# Patient Record
Sex: Female | Born: 1950 | Race: White | Hispanic: No
Health system: Southern US, Community
[De-identification: ages and names within clinical notes are randomized; demographics above are authoritative.]

---

## 1998-02-17 ENCOUNTER — Other Ambulatory Visit: Admission: RE | Admit: 1998-02-17 | Discharge: 1998-02-17 | Payer: Self-pay | Admitting: Family Medicine

## 1998-09-13 ENCOUNTER — Emergency Department (HOSPITAL_COMMUNITY): Admission: EM | Admit: 1998-09-13 | Discharge: 1998-09-13 | Payer: Self-pay | Admitting: Emergency Medicine

## 1998-10-02 ENCOUNTER — Other Ambulatory Visit: Admission: RE | Admit: 1998-10-02 | Discharge: 1998-10-02 | Payer: Self-pay | Admitting: Family Medicine

## 1999-04-07 ENCOUNTER — Other Ambulatory Visit: Admission: RE | Admit: 1999-04-07 | Discharge: 1999-04-07 | Payer: Self-pay | Admitting: Family Medicine

## 2000-10-18 ENCOUNTER — Other Ambulatory Visit: Admission: RE | Admit: 2000-10-18 | Discharge: 2000-10-18 | Payer: Self-pay | Admitting: Family Medicine

## 2000-11-09 ENCOUNTER — Inpatient Hospital Stay (HOSPITAL_COMMUNITY): Admission: RE | Admit: 2000-11-09 | Discharge: 2000-11-10 | Payer: Self-pay

## 2001-10-20 ENCOUNTER — Other Ambulatory Visit: Admission: RE | Admit: 2001-10-20 | Discharge: 2001-10-20 | Payer: Self-pay | Admitting: Family Medicine

## 2010-07-17 ENCOUNTER — Emergency Department (HOSPITAL_COMMUNITY)
Admission: EM | Admit: 2010-07-17 | Discharge: 2010-07-20 | Disposition: A | Discharge status: Other Acute Inpatient Hospital | Payer: 59 | Source: Home / Self Care | Attending: Emergency Medicine | Admitting: Emergency Medicine

## 2010-07-17 DIAGNOSIS — E785 Hyperlipidemia, unspecified: Secondary | ICD-10-CM | POA: Insufficient documentation

## 2010-07-17 DIAGNOSIS — J449 Chronic obstructive pulmonary disease, unspecified: Secondary | ICD-10-CM | POA: Insufficient documentation

## 2010-07-17 DIAGNOSIS — F29 Unspecified psychosis not due to a substance or known physiological condition: Secondary | ICD-10-CM | POA: Insufficient documentation

## 2010-07-17 DIAGNOSIS — J4489 Other specified chronic obstructive pulmonary disease: Secondary | ICD-10-CM | POA: Insufficient documentation

## 2010-07-17 DIAGNOSIS — G40909 Epilepsy, unspecified, not intractable, without status epilepticus: Secondary | ICD-10-CM | POA: Insufficient documentation

## 2010-07-17 LAB — DIFFERENTIAL
Basophils Relative: 1 % (ref 0–1)
Eosinophils Absolute: 0.4 10*3/uL (ref 0.0–0.7)
Eosinophils Relative: 4 % (ref 0–5)
Monocytes Absolute: 0.8 10*3/uL (ref 0.1–1.0)
Monocytes Relative: 9 % (ref 3–12)
Neutrophils Relative %: 68 % (ref 43–77)

## 2010-07-17 LAB — CBC
MCH: 30.6 pg (ref 26.0–34.0)
MCHC: 34.8 g/dL (ref 30.0–36.0)
Platelets: 239 10*3/uL (ref 150–400)

## 2010-07-17 LAB — COMPREHENSIVE METABOLIC PANEL
ALT: 12 U/L (ref 0–35)
AST: 15 U/L (ref 0–37)
Calcium: 9.7 mg/dL (ref 8.4–10.5)
Creatinine, Ser: 0.84 mg/dL (ref 0.4–1.2)
GFR calc Af Amer: >60 mL/min (ref >60)
Sodium: 130 mEq/L — ABNORMAL LOW (ref 135–145)
Total Protein: 6 g/dL (ref 6.0–8.3)

## 2010-07-17 LAB — URINALYSIS, ROUTINE W REFLEX MICROSCOPIC
Glucose, UA: NEGATIVE mg/dL
Leukocytes, UA: NEGATIVE
Nitrite: NEGATIVE
Protein, ur: NEGATIVE mg/dL
pH: 7.5 (ref 5.0–8.0)

## 2010-07-17 LAB — RAPID URINE DRUG SCREEN, HOSP PERFORMED
Amphetamines: NOT DETECTED
Opiates: NOT DETECTED

## 2010-07-17 LAB — LITHIUM LEVEL: Lithium Lvl: <0.25 mEq/L — ABNORMAL LOW (ref 0.80–1.40)

## 2010-07-18 DIAGNOSIS — F333 Major depressive disorder, recurrent, severe with psychotic symptoms: Secondary | ICD-10-CM

## 2010-07-19 DIAGNOSIS — F200 Paranoid schizophrenia: Secondary | ICD-10-CM

## 2010-07-20 ENCOUNTER — Inpatient Hospital Stay (HOSPITAL_COMMUNITY)
Admission: RE | Admit: 2010-07-20 | Discharge: 2010-07-24 | DRG: 885 | Disposition: A | Discharge status: Home / Self Care | Payer: 59 | Source: Ambulatory Visit | Attending: Psychiatry | Admitting: Psychiatry

## 2010-07-20 DIAGNOSIS — K219 Gastro-esophageal reflux disease without esophagitis: Secondary | ICD-10-CM

## 2010-07-20 DIAGNOSIS — E781 Pure hyperglyceridemia: Secondary | ICD-10-CM

## 2010-07-20 DIAGNOSIS — J45909 Unspecified asthma, uncomplicated: Secondary | ICD-10-CM

## 2010-07-20 DIAGNOSIS — I10 Essential (primary) hypertension: Secondary | ICD-10-CM

## 2010-07-20 DIAGNOSIS — F209 Schizophrenia, unspecified: Principal | ICD-10-CM

## 2010-07-20 DIAGNOSIS — H409 Unspecified glaucoma: Secondary | ICD-10-CM

## 2010-07-20 DIAGNOSIS — F17200 Nicotine dependence, unspecified, uncomplicated: Secondary | ICD-10-CM

## 2010-07-20 DIAGNOSIS — E785 Hyperlipidemia, unspecified: Secondary | ICD-10-CM

## 2010-07-20 DIAGNOSIS — F200 Paranoid schizophrenia: Secondary | ICD-10-CM

## 2010-07-21 DIAGNOSIS — F209 Schizophrenia, unspecified: Secondary | ICD-10-CM

## 2010-07-21 NOTE — H&P (Signed)
NAME:  [place], MARSAN NO.:  [id number]  MEDICAL RECORD NO.:  [id number]  LOCATION:  0502                          FACILITY:  [facility]  PHYSICIAN:  Franchot Gallo, MD     DATE OF BIRTH:  1950/10/08  DATE OF ADMISSION:  07/20/2010 DATE OF DISCHARGE:                      PSYCHIATRIC ADMISSION ASSESSMENT   CHIEF COMPLAINT:  "I am hearing voices."  HISTORY OF PRESENT ILLNESS:  Ms. [place] is a 60 year old divorced white female who was admitted to [facility] for evaluation of psychotic symptoms.  The patient states that she has been experiencing auditory hallucinations for quite some time but states that she cannot understand what the voices are saying.  She also reports to seeing people that she describes as "the cookie man."  The patient states that she has been taking her medications as prescribed.  The patient also has delusional thinking that members of her family arm trying to harm her.The patient reports that she is having difficulty initiating and maintaining sleep as well as a decreased appetite.  The patient reports severe feelings of sadness, anhedonia and depressed mood.  She reports episodic suicidal ideations without plan or intent but denies any suicidal ideations at time of evaluation.  She also denies any homicidal ideations.  The patient denies any significant anxiety symptoms or any panic symptoms.  She also denies any past or current manic or hypomanic symptoms.  She reports to smoking approximately three packs of cigarettes per day but denies any use of alcohol or illicit drugs.  The patient is a rather poor historian but states that she has experienced psychiatric symptoms for 40 years.  She is being admitted for evaluation and treatment of the above symptoms.  PAST PSYCHIATRIC HISTORY:  Again, the patient is able to give only partial information but reports to lease four past psychiatric hospitalizations.  She states that [place]  [place] [facility] [place] [place]  She is unable to describe medications that she has been on in the past.  PAST MEDICAL HISTORY:  Current medications - 1. Lithium carbonate 900 mg p.o. q bedtime. 2. Risperdal 6 mg p.o. q bedtime. 3. Amantadine 100 mg p.o. q bedtime. 4. Travoprost ophthalmic drops 0.004% one drop to both eyes daily. 5. Lasix 25 mg p.o. q.a.m.. 6. Estradiol 1 mg p.o. q.a.m.Marland Kitchen 7. Albuterol inhaler 2 puffs q.6 hours as needed for shortness of     breath. 8. Simvastatin 20 mg p.o. q.a.m.. 9. Multivitamin p.o. q.a.m.Marland Kitchen 10.Nexium and 40 mg capsules, 1 capsule p.o. b.i.d.. 11.TriCor 145 mg p.o. q.a.m..  Allergies - NKDA.  Medical illnesses - 1. Glaucoma. 2. Hypertension. 3. Postmenopausal female. 4. Asthma. 5. Hyperlipidemia. 6. GE reflux disease. 7. Hypertriglyceridemia.  Past operations - unknown.  FAMILY HISTORY:  The patient is unable to report any family history of psychiatric or substance abuse related illnesses.  She did state that several individuals in her family "should be on medications."  SOCIAL HISTORY:  The patient was born in [place] and was raised in [place]  [place]  The patient reports to be married on one occasion but states that this marriage ended in a divorce.  She reports to completing the tenth grade of school and is currently receiving disability for her psychiatric symptoms.  The patient states that she is smoking three packs of cigarettes per day but denies any use of alcohol or illicit drugs.  MENTAL STATUS EXAM:  General - the patient was alert and oriented x3. She was minimally cooperative and appeared to be floridly psychotic throughout the evaluation.  Speech was appropriate in terms of volume but decreased rate.  Mood appeared severely  depressed.  Affect was essentially flat. Thoughts - the patient reported both auditory and visual hallucinations as well as delusional thinking as described in the HPI.  She reported episodic suicidal ideations but not at time of evaluation.  She denied any homicidal ideations.  Judgment and insight both appear poor.  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 Nicotine dependence. AXIS II:  Deferred. AXIS III: Please see past medical history as stated above. AXIS IV:  Serious chronic mental illness.  Multiple non psychiatric health problems.  Limited primary support system. AXIS V:  GAF at time of admission approximately 30.  Highest GAF in past year approximately 45.  PLAN: 1. The patient was started on the medication Zoloft 50 mg p.o. q.a.m.     to address her depressive symptoms. 2. The patient was restarted on Risperdal but at 3 mg p.o. b.i.d. to     address her psychosis. 3. The patient was continued on the medication lithium carbonate 900     mg p.o. q bedtime for mood stabilization and augment her     antidepressant regimen. 4. The patient was continued on all of her non psychiatric medications     as she was taking at home. 5. A TSH, T3 uptake and T4 was ordered to assess the patient's current     thyroid status. 6. The patient will be closely monitored for dangerousness to self     and/or others. 7. The patient will participate in unit and group activities per     routine.    __________________________________ Franchot Gallo, MD     RR/MEDQ  D:  07/21/2010  T:  07/21/2010  Job:  [id number]  Electronically Signed by Franchot Gallo MD on 07/21/2010 08:24:32 PM

## 2010-07-22 LAB — TSH: TSH: 2.239 u[IU]/mL (ref 0.350–4.500)

## 2010-07-22 LAB — T3 UPTAKE: T3 Uptake Ratio: 32.9 % (ref 22.5–37.0)

## 2010-07-23 LAB — LITHIUM LEVEL: Lithium Lvl: 0.66 mEq/L — ABNORMAL LOW (ref 0.80–1.40)

## 2010-07-28 NOTE — Discharge Summary (Signed)
  NAMESYDNEE, [place]                 ACCOUNT NO.:  [id number]  MEDICAL RECORD NO.:  [id number]  LOCATION:  0502                          FACILITY:  [facility]  PHYSICIAN:  Franchot Gallo, MD     DATE OF BIRTH:  09/04/1950  DATE OF ADMISSION:  07/20/2010 DATE OF DISCHARGE:  07/24/2010                              DISCHARGE SUMMARY   REASON FOR ADMISSION:  Patient was reporting hearing voices.  This is a 60 year old female that was admitted after evaluation of her psychotic symptoms.  Experienced auditory hallucinations for quite some time. Also seeing people that she describes as "the cookie man."  She reports compliance with her medications, and she is also having delusional thinking that her family members are trying to harm her.  FINAL IMPRESSION:  Axis I:  Schizophrenia undifferentiated type currently under poor control.  Nicotine dependence. Axis II:  Deferred. Axis III:  A history of glaucoma.  Hypertension.  Asthma. Hyperlipidemia.  Gastrointestinal reflux.  Hypertriglyceridemia. Axis V:  Current is 50-55.  PERTINENT LABORATORIES:  Urine drug screen is negative.  Sodium of 130. Alcohol level less than 11.  Lithium level less than 0.25.  Significant labs.  Patient was started on Zoloft 50 mg to address her depressive symptoms and Risperdal to address psychosis.  We also continued with the lithium 900 mg for mood stabilization.  She was admitted on the adult milieu and the mood disorder group.  The patient was reporting her sleep was good. Her appetite was good, having mild depressive symptoms rating it at 5 on a scale of 1-10.  She was denied any suicidal thoughts or homicidal ideation.  She was hearing no voices.  She was experiencing some daytime sedation, and we changed her Risperdal to bedtime.  The patient was active in attending groups.  We increased her Zoloft to 100 mg to lessen her depressive symptoms, and decreased her Risperdal as the patient was continuing with  oversedation.  On day of discharge patient's sleep was good.  Her appetite was good.  Her depression had resolved rating it at 0 on a scale of 1-10.  Adamantly denied any suicidal or homicidal thoughts or auditory hallucinations.  Anxiety was good and having no medication side effects.  DISCHARGE MEDICATIONS: 1. Risperdal 2 mg taking 2 q.h.s. 2. Zoloft 100 mg daily. 3. Amantadine 100 mg q.h.s. 4. Estradiol 1 mg daily. 5. Lasix 20 mg taking 1/2 to 1 every morning. 6. Lithium carbonate 300 mg, taking 3 at bedtime. 7. Multivitamin 1 daily. 8. Nexium 40 mg b.i.d. 9. ProAir 2 puffs q.6 hours. 10.Simvastatin  20 mg daily. 11.Travatan drops to eyes daily. 12.TriCor 145 mg daily.  Her follow-up appointment is with Dr. Donell Beers on Thursday, June 28. Phone number [phone].     Landry Corporal, N.P.   ______________________________ Franchot Gallo, MD    JO/MEDQ  D:  07/27/2010  T:  07/27/2010  Job:  [id number]  Electronically Signed by Limmie PatriciaP. on 07/28/2010 10:04:38 AM Electronically Signed by Franchot Gallo MD on 07/28/2010 04:25:58 PM

## 2014-06-14 ENCOUNTER — Inpatient Hospital Stay (HOSPITAL_COMMUNITY): Payer: Medicare Other

## 2014-06-14 ENCOUNTER — Emergency Department (HOSPITAL_COMMUNITY): Payer: Medicare Other

## 2014-06-14 ENCOUNTER — Inpatient Hospital Stay (HOSPITAL_COMMUNITY)
Admission: EM | Admit: 2014-06-14 | Discharge: 2014-07-10 | DRG: 308 | Disposition: E | Payer: Medicare Other | Attending: Internal Medicine | Admitting: Internal Medicine

## 2014-06-14 DIAGNOSIS — F209 Schizophrenia, unspecified: Secondary | ICD-10-CM | POA: Diagnosis present

## 2014-06-14 DIAGNOSIS — R4020 Unspecified coma: Secondary | ICD-10-CM | POA: Diagnosis present

## 2014-06-14 DIAGNOSIS — R0600 Dyspnea, unspecified: Secondary | ICD-10-CM

## 2014-06-14 DIAGNOSIS — D751 Secondary polycythemia: Secondary | ICD-10-CM | POA: Diagnosis present

## 2014-06-14 DIAGNOSIS — J690 Pneumonitis due to inhalation of food and vomit: Secondary | ICD-10-CM

## 2014-06-14 DIAGNOSIS — Z8673 Personal history of transient ischemic attack (TIA), and cerebral infarction without residual deficits: Secondary | ICD-10-CM | POA: Diagnosis not present

## 2014-06-14 DIAGNOSIS — R34 Anuria and oliguria: Secondary | ICD-10-CM | POA: Diagnosis present

## 2014-06-14 DIAGNOSIS — R0989 Other specified symptoms and signs involving the circulatory and respiratory systems: Secondary | ICD-10-CM | POA: Diagnosis not present

## 2014-06-14 DIAGNOSIS — E878 Other disorders of electrolyte and fluid balance, not elsewhere classified: Secondary | ICD-10-CM | POA: Diagnosis present

## 2014-06-14 DIAGNOSIS — R739 Hyperglycemia, unspecified: Secondary | ICD-10-CM | POA: Diagnosis not present

## 2014-06-14 DIAGNOSIS — F17210 Nicotine dependence, cigarettes, uncomplicated: Secondary | ICD-10-CM | POA: Diagnosis present

## 2014-06-14 DIAGNOSIS — I4891 Unspecified atrial fibrillation: Secondary | ICD-10-CM | POA: Diagnosis present

## 2014-06-14 DIAGNOSIS — R4182 Altered mental status, unspecified: Secondary | ICD-10-CM | POA: Diagnosis present

## 2014-06-14 DIAGNOSIS — Z515 Encounter for palliative care: Secondary | ICD-10-CM | POA: Diagnosis not present

## 2014-06-14 DIAGNOSIS — K7469 Other cirrhosis of liver: Secondary | ICD-10-CM | POA: Diagnosis not present

## 2014-06-14 DIAGNOSIS — R570 Cardiogenic shock: Secondary | ICD-10-CM | POA: Diagnosis present

## 2014-06-14 DIAGNOSIS — I429 Cardiomyopathy, unspecified: Secondary | ICD-10-CM | POA: Diagnosis present

## 2014-06-14 DIAGNOSIS — J449 Chronic obstructive pulmonary disease, unspecified: Secondary | ICD-10-CM | POA: Diagnosis present

## 2014-06-14 DIAGNOSIS — K5900 Constipation, unspecified: Secondary | ICD-10-CM | POA: Diagnosis not present

## 2014-06-14 DIAGNOSIS — J189 Pneumonia, unspecified organism: Secondary | ICD-10-CM | POA: Diagnosis not present

## 2014-06-14 DIAGNOSIS — R627 Adult failure to thrive: Secondary | ICD-10-CM | POA: Diagnosis not present

## 2014-06-14 DIAGNOSIS — I480 Paroxysmal atrial fibrillation: Secondary | ICD-10-CM | POA: Diagnosis present

## 2014-06-14 DIAGNOSIS — K7460 Unspecified cirrhosis of liver: Secondary | ICD-10-CM | POA: Diagnosis present

## 2014-06-14 DIAGNOSIS — F319 Bipolar disorder, unspecified: Secondary | ICD-10-CM | POA: Diagnosis present

## 2014-06-14 DIAGNOSIS — R6521 Severe sepsis with septic shock: Secondary | ICD-10-CM

## 2014-06-14 DIAGNOSIS — E46 Unspecified protein-calorie malnutrition: Secondary | ICD-10-CM | POA: Diagnosis not present

## 2014-06-14 DIAGNOSIS — J9600 Acute respiratory failure, unspecified whether with hypoxia or hypercapnia: Secondary | ICD-10-CM | POA: Diagnosis not present

## 2014-06-14 DIAGNOSIS — Z7951 Long term (current) use of inhaled steroids: Secondary | ICD-10-CM

## 2014-06-14 DIAGNOSIS — Z452 Encounter for adjustment and management of vascular access device: Secondary | ICD-10-CM

## 2014-06-14 DIAGNOSIS — Z6841 Body Mass Index (BMI) 40.0 and over, adult: Secondary | ICD-10-CM | POA: Diagnosis not present

## 2014-06-14 DIAGNOSIS — R410 Disorientation, unspecified: Secondary | ICD-10-CM | POA: Diagnosis not present

## 2014-06-14 DIAGNOSIS — R9089 Other abnormal findings on diagnostic imaging of central nervous system: Secondary | ICD-10-CM

## 2014-06-14 DIAGNOSIS — E785 Hyperlipidemia, unspecified: Secondary | ICD-10-CM | POA: Diagnosis present

## 2014-06-14 DIAGNOSIS — Z66 Do not resuscitate: Secondary | ICD-10-CM | POA: Diagnosis present

## 2014-06-14 DIAGNOSIS — R930 Abnormal findings on diagnostic imaging of skull and head, not elsewhere classified: Secondary | ICD-10-CM | POA: Diagnosis not present

## 2014-06-14 DIAGNOSIS — G9341 Metabolic encephalopathy: Secondary | ICD-10-CM | POA: Diagnosis present

## 2014-06-14 DIAGNOSIS — E43 Unspecified severe protein-calorie malnutrition: Secondary | ICD-10-CM | POA: Diagnosis not present

## 2014-06-14 DIAGNOSIS — N179 Acute kidney failure, unspecified: Secondary | ICD-10-CM | POA: Diagnosis not present

## 2014-06-14 DIAGNOSIS — J9621 Acute and chronic respiratory failure with hypoxia: Secondary | ICD-10-CM | POA: Diagnosis not present

## 2014-06-14 DIAGNOSIS — D696 Thrombocytopenia, unspecified: Secondary | ICD-10-CM | POA: Diagnosis present

## 2014-06-14 DIAGNOSIS — K2270 Barrett's esophagus without dysplasia: Secondary | ICD-10-CM | POA: Diagnosis present

## 2014-06-14 DIAGNOSIS — R680 Hypothermia, not associated with low environmental temperature: Secondary | ICD-10-CM | POA: Diagnosis present

## 2014-06-14 DIAGNOSIS — R0689 Other abnormalities of breathing: Secondary | ICD-10-CM

## 2014-06-14 DIAGNOSIS — D45 Polycythemia vera: Secondary | ICD-10-CM

## 2014-06-14 DIAGNOSIS — Z79899 Other long term (current) drug therapy: Secondary | ICD-10-CM

## 2014-06-14 DIAGNOSIS — I4892 Unspecified atrial flutter: Principal | ICD-10-CM | POA: Diagnosis present

## 2014-06-14 DIAGNOSIS — J9601 Acute respiratory failure with hypoxia: Secondary | ICD-10-CM | POA: Diagnosis not present

## 2014-06-14 DIAGNOSIS — I5021 Acute systolic (congestive) heart failure: Secondary | ICD-10-CM | POA: Diagnosis not present

## 2014-06-14 DIAGNOSIS — E039 Hypothyroidism, unspecified: Secondary | ICD-10-CM | POA: Diagnosis present

## 2014-06-14 DIAGNOSIS — I2510 Atherosclerotic heart disease of native coronary artery without angina pectoris: Secondary | ICD-10-CM | POA: Diagnosis present

## 2014-06-14 DIAGNOSIS — E871 Hypo-osmolality and hyponatremia: Secondary | ICD-10-CM | POA: Diagnosis present

## 2014-06-14 DIAGNOSIS — J90 Pleural effusion, not elsewhere classified: Secondary | ICD-10-CM

## 2014-06-14 DIAGNOSIS — J9690 Respiratory failure, unspecified, unspecified whether with hypoxia or hypercapnia: Secondary | ICD-10-CM

## 2014-06-14 DIAGNOSIS — G9340 Encephalopathy, unspecified: Secondary | ICD-10-CM | POA: Diagnosis not present

## 2014-06-14 DIAGNOSIS — A419 Sepsis, unspecified organism: Secondary | ICD-10-CM

## 2014-06-14 DIAGNOSIS — E2740 Unspecified adrenocortical insufficiency: Secondary | ICD-10-CM | POA: Diagnosis present

## 2014-06-14 DIAGNOSIS — R079 Chest pain, unspecified: Secondary | ICD-10-CM

## 2014-06-14 DIAGNOSIS — R401 Stupor: Secondary | ICD-10-CM | POA: Diagnosis not present

## 2014-06-14 LAB — I-STAT ARTERIAL BLOOD GAS, ED
ACID-BASE DEFICIT: 6 mmol/L — AB (ref 0.0–2.0)
Bicarbonate: 22.8 mEq/L (ref 20.0–24.0)
O2 Saturation: 98 %
PO2 ART: 131 mmHg — AB (ref 80.0–100.0)
TCO2: 24 mmol/L (ref 0–100)
pCO2 arterial: 55.8 mmHg — ABNORMAL HIGH (ref 35.0–45.0)
pH, Arterial: 7.219 — ABNORMAL LOW (ref 7.350–7.450)

## 2014-06-14 LAB — NA AND K (SODIUM & POTASSIUM), RAND UR
Potassium Urine: 18 mmol/L
Sodium, Ur: <10 mmol/L

## 2014-06-14 LAB — COMPREHENSIVE METABOLIC PANEL
ALK PHOS: 200 U/L — AB (ref 38–126)
ALT: 36 U/L (ref 14–54)
ANION GAP: 14 (ref 5–15)
AST: 73 U/L — ABNORMAL HIGH (ref 15–41)
Albumin: 2.8 g/dL — ABNORMAL LOW (ref 3.5–5.0)
BUN: 13 mg/dL (ref 6–20)
CHLORIDE: 83 mmol/L — AB (ref 101–111)
CO2: 18 mmol/L — ABNORMAL LOW (ref 22–32)
Calcium: 8.3 mg/dL — ABNORMAL LOW (ref 8.9–10.3)
Creatinine, Ser: 0.84 mg/dL (ref 0.44–1.00)
Glucose, Bld: 128 mg/dL — ABNORMAL HIGH (ref 70–99)
POTASSIUM: 5 mmol/L (ref 3.5–5.1)
Sodium: 115 mmol/L — CL (ref 135–145)
Total Bilirubin: 3.1 mg/dL — ABNORMAL HIGH (ref 0.3–1.2)
Total Protein: 5.5 g/dL — ABNORMAL LOW (ref 6.5–8.1)

## 2014-06-14 LAB — BASIC METABOLIC PANEL
ANION GAP: 13 (ref 5–15)
BUN: 15 mg/dL (ref 6–20)
CHLORIDE: 85 mmol/L — AB (ref 101–111)
CO2: 21 mmol/L — AB (ref 22–32)
Calcium: 7.9 mg/dL — ABNORMAL LOW (ref 8.9–10.3)
Creatinine, Ser: 1.14 mg/dL — ABNORMAL HIGH (ref 0.44–1.00)
GFR calc non Af Amer: 51 mL/min — ABNORMAL LOW (ref >60)
GFR, EST AFRICAN AMERICAN: 59 mL/min — AB (ref >60)
Glucose, Bld: 151 mg/dL — ABNORMAL HIGH (ref 70–99)
POTASSIUM: 3.7 mmol/L (ref 3.5–5.1)
SODIUM: 119 mmol/L — AB (ref 135–145)

## 2014-06-14 LAB — SALICYLATE LEVEL: Salicylate Lvl: <4 mg/dL (ref 2.8–30.0)

## 2014-06-14 LAB — CBC WITH DIFFERENTIAL/PLATELET
BASOS ABS: 0 10*3/uL (ref 0.0–0.1)
Basophils Relative: 0 % (ref 0–1)
EOS ABS: 0 10*3/uL (ref 0.0–0.7)
Eosinophils Relative: 0 % (ref 0–5)
HCT: 56.7 % — ABNORMAL HIGH (ref 36.0–46.0)
Hemoglobin: 20.5 g/dL — ABNORMAL HIGH (ref 12.0–15.0)
Lymphocytes Relative: 4 % — ABNORMAL LOW (ref 12–46)
Lymphs Abs: 0.4 10*3/uL — ABNORMAL LOW (ref 0.7–4.0)
MCH: 30 pg (ref 26.0–34.0)
MCHC: 36.2 g/dL — AB (ref 30.0–36.0)
MCV: 82.9 fL (ref 78.0–100.0)
Monocytes Absolute: 0.7 10*3/uL (ref 0.1–1.0)
Monocytes Relative: 6 % (ref 3–12)
NEUTROS PCT: 89 % — AB (ref 43–77)
Neutro Abs: 9.2 10*3/uL — ABNORMAL HIGH (ref 1.7–7.7)
PLATELETS: 116 10*3/uL — AB (ref 150–400)
RBC: 6.84 MIL/uL — ABNORMAL HIGH (ref 3.87–5.11)
RDW: 18 % — AB (ref 11.5–15.5)
WBC: 10.3 10*3/uL (ref 4.0–10.5)

## 2014-06-14 LAB — URINALYSIS, ROUTINE W REFLEX MICROSCOPIC
GLUCOSE, UA: NEGATIVE mg/dL
Ketones, ur: 15 mg/dL — AB
NITRITE: NEGATIVE
PH: 5.5 (ref 5.0–8.0)
PROTEIN: 100 mg/dL — AB
Specific Gravity, Urine: 1.021 (ref 1.005–1.030)
UROBILINOGEN UA: 2 mg/dL — AB (ref 0.0–1.0)

## 2014-06-14 LAB — FERRITIN: FERRITIN: 115 ng/mL (ref 11–307)

## 2014-06-14 LAB — PROTIME-INR
INR: 1.42 (ref 0.00–1.49)
Prothrombin Time: 17.5 seconds — ABNORMAL HIGH (ref 11.6–15.2)

## 2014-06-14 LAB — TROPONIN I
TROPONIN I: 0.08 ng/mL — AB (ref <0.031)
Troponin I: 0.08 ng/mL — ABNORMAL HIGH (ref <0.031)
Troponin I: 0.09 ng/mL — ABNORMAL HIGH (ref <0.031)

## 2014-06-14 LAB — RAPID URINE DRUG SCREEN, HOSP PERFORMED
Amphetamines: NOT DETECTED
BARBITURATES: NOT DETECTED
Benzodiazepines: NOT DETECTED
Cocaine: NOT DETECTED
Opiates: NOT DETECTED
Tetrahydrocannabinol: NOT DETECTED

## 2014-06-14 LAB — IRON AND TIBC
Iron: 144 ug/dL (ref 28–170)
SATURATION RATIOS: 42 % — AB (ref 10.4–31.8)
TIBC: 343 ug/dL (ref 250–450)
UIBC: 199 ug/dL

## 2014-06-14 LAB — GLUCOSE, CAPILLARY
GLUCOSE-CAPILLARY: 112 mg/dL — AB (ref 70–99)
Glucose-Capillary: 141 mg/dL — ABNORMAL HIGH (ref 70–99)
Glucose-Capillary: 125 mg/dL — ABNORMAL HIGH (ref 70–99)

## 2014-06-14 LAB — I-STAT CHEM 8, ED
BUN: 17 mg/dL (ref 6–20)
CREATININE: 0.8 mg/dL (ref 0.44–1.00)
Calcium, Ion: 0.98 mmol/L — ABNORMAL LOW (ref 1.13–1.30)
Chloride: 82 mmol/L — ABNORMAL LOW (ref 101–111)
Glucose, Bld: 132 mg/dL — ABNORMAL HIGH (ref 70–99)
HEMATOCRIT: 69 % — AB (ref 36.0–46.0)
HEMOGLOBIN: 23.5 g/dL — AB (ref 12.0–15.0)
Potassium: 4.5 mmol/L (ref 3.5–5.1)
SODIUM: 116 mmol/L — AB (ref 135–145)
TCO2: 21 mmol/L (ref 0–100)

## 2014-06-14 LAB — OSMOLALITY: Osmolality: 247 mOsm/kg — ABNORMAL LOW (ref 275–300)

## 2014-06-14 LAB — VITAMIN B12: VITAMIN B 12: 1388 pg/mL — AB (ref 180–914)

## 2014-06-14 LAB — I-STAT CG4 LACTIC ACID, ED: LACTIC ACID, VENOUS: 3.51 mmol/L — AB (ref 0.5–2.0)

## 2014-06-14 LAB — OSMOLALITY, URINE: Osmolality, Ur: 300 mOsm/kg — ABNORMAL LOW (ref 390–1090)

## 2014-06-14 LAB — PROCALCITONIN: Procalcitonin: 1.06 ng/mL

## 2014-06-14 LAB — MRSA PCR SCREENING: MRSA BY PCR: POSITIVE — AB

## 2014-06-14 LAB — ETHANOL

## 2014-06-14 LAB — URINE MICROSCOPIC-ADD ON

## 2014-06-14 LAB — BRAIN NATRIURETIC PEPTIDE: B NATRIURETIC PEPTIDE 5: 357.3 pg/mL — AB (ref 0.0–100.0)

## 2014-06-14 LAB — TECHNOLOGIST SMEAR REVIEW

## 2014-06-14 LAB — CREATININE, URINE, RANDOM: CREATININE, URINE: 103.16 mg/dL

## 2014-06-14 LAB — CORTISOL: CORTISOL PLASMA: 74.6 ug/dL

## 2014-06-14 LAB — CK: CK TOTAL: 844 U/L — AB (ref 38–234)

## 2014-06-14 LAB — I-STAT TROPONIN, ED: Troponin i, poc: 0.03 ng/mL (ref 0.00–0.08)

## 2014-06-14 LAB — ACETAMINOPHEN LEVEL

## 2014-06-14 MED ORDER — PANTOPRAZOLE SODIUM 40 MG IV SOLR
40.0000 mg | INTRAVENOUS | Status: DC
  Administered 2014-06-14 – 2014-06-15 (×2): 40 mg via INTRAVENOUS
  Filled 2014-06-14 (×2): qty 40

## 2014-06-14 MED ORDER — ETOMIDATE 2 MG/ML IV SOLN
INTRAVENOUS | Status: AC | PRN
Start: 2014-06-14 — End: 2014-06-14
  Administered 2014-06-14: 30 mg via INTRAVENOUS

## 2014-06-14 MED ORDER — PRO-STAT SUGAR FREE PO LIQD
30.0000 mL | Freq: Two times a day (BID) | ORAL | Status: AC
  Administered 2014-06-14 (×2): 30 mL
  Filled 2014-06-14 (×2): qty 30

## 2014-06-14 MED ORDER — MUPIROCIN 2 % EX OINT
1.0000 "application " | TOPICAL_OINTMENT | Freq: Two times a day (BID) | CUTANEOUS | Status: DC
  Administered 2014-06-14 – 2014-06-18 (×9): 1 via NASAL
  Filled 2014-06-14: qty 22

## 2014-06-14 MED ORDER — VANCOMYCIN HCL IN DEXTROSE 1-5 GM/200ML-% IV SOLN
1000.0000 mg | Freq: Two times a day (BID) | INTRAVENOUS | Status: DC
Start: 2014-06-14 — End: 2014-06-15
  Administered 2014-06-14 – 2014-06-15 (×2): 1000 mg via INTRAVENOUS
  Filled 2014-06-14 (×4): qty 200

## 2014-06-14 MED ORDER — CHLORHEXIDINE GLUCONATE CLOTH 2 % EX PADS
6.0000 | MEDICATED_PAD | Freq: Every day | CUTANEOUS | Status: AC
  Administered 2014-06-15 – 2014-06-19 (×5): 6 via TOPICAL

## 2014-06-14 MED ORDER — ROCURONIUM BROMIDE 50 MG/5ML IV SOLN
INTRAVENOUS | Status: AC | PRN
  Administered 2014-06-14: 100 mg via INTRAVENOUS

## 2014-06-14 MED ORDER — VANCOMYCIN HCL IN DEXTROSE 1-5 GM/200ML-% IV SOLN: 1000.0000 mg | Freq: Once | INTRAVENOUS | Status: DC

## 2014-06-14 MED ORDER — VANCOMYCIN HCL 10 G IV SOLR
2000.0000 mg | Freq: Once | INTRAVENOUS | Status: AC
  Administered 2014-06-14: 2000 mg via INTRAVENOUS
  Filled 2014-06-14: qty 2000

## 2014-06-14 MED ORDER — MUPIROCIN CALCIUM 2 % EX CREA
TOPICAL_CREAM | Freq: Two times a day (BID) | CUTANEOUS | Status: DC
  Administered 2014-06-14 (×2): via TOPICAL
  Administered 2014-06-15: 1 via TOPICAL
  Administered 2014-06-15: 10:00:00 via TOPICAL
  Administered 2014-06-16: 1 via TOPICAL
  Administered 2014-06-16 – 2014-06-20 (×9): via TOPICAL
  Administered 2014-06-21: 1 via TOPICAL
  Administered 2014-06-21: 11:00:00 via TOPICAL
  Administered 2014-06-22: 1 via TOPICAL
  Administered 2014-06-22 – 2014-06-23 (×2): via TOPICAL
  Administered 2014-06-23: 1 via TOPICAL
  Administered 2014-06-24 – 2014-06-26 (×5): via TOPICAL
  Filled 2014-06-14 (×2): qty 15

## 2014-06-14 MED ORDER — PIPERACILLIN-TAZOBACTAM 3.375 G IVPB 30 MIN
3.3750 g | Freq: Once | INTRAVENOUS | Status: AC
  Administered 2014-06-14: 3.375 g via INTRAVENOUS
  Filled 2014-06-14: qty 50

## 2014-06-14 MED ORDER — HYDROCORTISONE NA SUCCINATE PF 100 MG IJ SOLR
50.0000 mg | Freq: Four times a day (QID) | INTRAMUSCULAR | Status: DC
  Administered 2014-06-14 – 2014-06-16 (×9): 50 mg via INTRAVENOUS
  Filled 2014-06-14 (×2): qty 1
  Filled 2014-06-14 (×2): qty 2
  Filled 2014-06-14 (×2): qty 1
  Filled 2014-06-14: qty 2
  Filled 2014-06-14 (×4): qty 1
  Filled 2014-06-14: qty 2

## 2014-06-14 MED ORDER — PIPERACILLIN-TAZOBACTAM 3.375 G IVPB
3.3750 g | Freq: Three times a day (TID) | INTRAVENOUS | Status: DC
  Administered 2014-06-14 – 2014-06-19 (×15): 3.375 g via INTRAVENOUS
  Filled 2014-06-14 (×17): qty 50

## 2014-06-14 MED ORDER — MUPIROCIN 2 % EX OINT: TOPICAL_OINTMENT | Freq: Two times a day (BID) | CUTANEOUS | Status: DC

## 2014-06-14 MED ORDER — SODIUM CHLORIDE 0.9 % IV BOLUS (SEPSIS)
2000.0000 mL | Freq: Once | INTRAVENOUS | Status: AC
  Administered 2014-06-14: 2000 mL via INTRAVENOUS

## 2014-06-14 MED ORDER — NOREPINEPHRINE BITARTRATE 1 MG/ML IV SOLN
0.0000 ug/min | INTRAVENOUS | Status: DC
  Administered 2014-06-14: 2 ug/min via INTRAVENOUS
  Administered 2014-06-14: 10 ug/min via INTRAVENOUS
  Filled 2014-06-14 (×3): qty 4

## 2014-06-14 MED ORDER — CETYLPYRIDINIUM CHLORIDE 0.05 % MT LIQD
7.0000 mL | Freq: Four times a day (QID) | OROMUCOSAL | Status: DC
Start: 2014-06-14 — End: 2014-06-24
  Administered 2014-06-14 – 2014-06-24 (×38): 7 mL via OROMUCOSAL

## 2014-06-14 MED ORDER — ASPIRIN 81 MG PO CHEW
81.0000 mg | CHEWABLE_TABLET | Freq: Every day | ORAL | Status: DC
  Administered 2014-06-14 – 2014-06-17 (×4): 81 mg via ORAL
  Filled 2014-06-14 (×4): qty 1

## 2014-06-14 MED ORDER — CHLORHEXIDINE GLUCONATE 0.12 % MT SOLN
15.0000 mL | Freq: Two times a day (BID) | OROMUCOSAL | Status: DC
  Administered 2014-06-14 – 2014-06-24 (×21): 15 mL via OROMUCOSAL
  Filled 2014-06-14 (×16): qty 15

## 2014-06-14 MED ORDER — EPINEPHRINE HCL 1 MG/ML IJ SOLN: 0.5000 mg | Freq: Once | INTRAMUSCULAR | Status: DC

## 2014-06-14 MED ORDER — VITAL HIGH PROTEIN PO LIQD
1000.0000 mL | ORAL | Status: DC
  Administered 2014-06-14 – 2014-06-15 (×2)
  Administered 2014-06-15 – 2014-06-19 (×5): 1000 mL
  Administered 2014-06-19 – 2014-06-20 (×2)
  Administered 2014-06-20: 1000 mL
  Administered 2014-06-20: 18:00:00
  Administered 2014-06-21 – 2014-06-23 (×3): 1000 mL
  Filled 2014-06-14 (×18): qty 1000

## 2014-06-14 MED ORDER — SODIUM CHLORIDE 0.9 % IV SOLN
INTRAVENOUS | Status: DC
  Administered 2014-06-14: 09:00:00 via INTRAVENOUS

## 2014-06-14 MED ORDER — HYDROCERIN EX CREA
TOPICAL_CREAM | Freq: Two times a day (BID) | CUTANEOUS | Status: DC
  Administered 2014-06-14 (×2): via TOPICAL
  Administered 2014-06-15: 1 via TOPICAL
  Administered 2014-06-15: 09:00:00 via TOPICAL
  Administered 2014-06-16: 1 via TOPICAL
  Administered 2014-06-16 – 2014-06-20 (×9): via TOPICAL
  Administered 2014-06-21: 1 via TOPICAL
  Administered 2014-06-21: 10:00:00 via TOPICAL
  Administered 2014-06-22: 1 via TOPICAL
  Administered 2014-06-22 – 2014-06-23 (×2): via TOPICAL
  Administered 2014-06-23 – 2014-06-24 (×2): 1 via TOPICAL
  Administered 2014-06-24 – 2014-06-26 (×4): via TOPICAL
  Filled 2014-06-14 (×2): qty 113

## 2014-06-14 NOTE — Progress Notes (Signed)
At about 06:45 I responded to trauma room C to receive an unresponsive patient in respiratory distress. When she arrived, I observed RR 25, SAT 97% on NRM, BP 114/71 HR 78. I assisted Dr Alvino Chapel with intubating this pt. He successfully orally intubated her on the first attempt using a 7.5 tube. The tube was secured with a commercial tube holder at the 21 cm lip line. I gave report to the oncoming respiratory therapist, Merry Proud, at the patient's bedside

## 2014-06-14 NOTE — ED Notes (Signed)
Pt transported to CT with this RN and RT. [name] met in CT.

## 2014-06-14 NOTE — Progress Notes (Signed)
Initial Nutrition Assessment  DOCUMENTATION CODES:  Obesity unspecified  INTERVENTION:   Tube feeding   Initiate TF via OGT with Vital High Protein at 25 ml/h and Prostat 30 ml BID on day 1; on day 2, d/c Prostat and increase to goal rate of 50 ml/h (1200 ml per day) to provide 1200 kcals, 105 gm protein, 1003 ml free water daily.   NUTRITION DIAGNOSIS:  Inadequate oral intake related to inability to eat as evidenced by NPO status.   GOAL:  Provide needs based on ASPEN/SCCM guidelines  MONITOR:  Vent status, Labs, Weight trends, TF tolerance  REASON FOR ASSESSMENT:  Consult Enteral/tube feeding initiation and management  ASSESSMENT:  Patient admitted 5/6 after being found unresponsive by family. Intubated in the ED.  Labs reviewed. Sodium low at 116. Received MD Consult for TF initiation and management.  Patient is currently intubated on ventilator support MV: 8.2 L/min Temp (24hrs), Avg:99.3 F (37.4 C), Min:96.4 F (35.8 C), Max:100.6 F (38.1 C)  Propofol: none  Height:  Ht Readings from Last 1 Encounters:  06/17/2014 5\' 3"  (1.6 m)    Weight:  Wt Readings from Last 1 Encounters:  06/12/2014 190 lb (86.183 kg)    Ideal Body Weight:  52.3 kg  Wt Readings from Last 10 Encounters:  07/06/2014 190 lb (86.183 kg)    BMI:  Body mass index is 33.67 kg/(m^2).  Estimated Nutritional Needs:  Kcal:  [phone]  Protein:  105 gm  Fluid:  >/= 1.5 L  Skin:  Wound (see comment) (legs red with open areas)  Diet Order:     EDUCATION NEEDS:  No education needs identified at this time   Intake/Output Summary (Last 24 hours) at 06/17/2014 1103 Last data filed at 06/27/2014 0926  Gross per 24 hour  Intake   1520 ml  Output     20 ml  Net   1500 ml    Last BM:  unknown   Molli Barrows, RD, LDN, [place] Pager [phone] After Hours Pager [phone]

## 2014-06-14 NOTE — ED Notes (Signed)
Pt cardioverted by dr Alvino Chapel. Pt tolerated well. No response to pain. Pt returned to sinus tach.

## 2014-06-14 NOTE — Progress Notes (Signed)
Dr. Jimmy Footman notified re: Sodium 119. No further orders.

## 2014-06-14 NOTE — ED Notes (Signed)
Was ok at dinner, found this am unresponsive on kitchen floor by brother who states patient has been lethargic over past 2 days.  Patient's family poor historian.   Hypotensive

## 2014-06-14 NOTE — Progress Notes (Signed)
Pt placed on charted vent settings by day-shift RT Merry Proud

## 2014-06-14 NOTE — ED Provider Notes (Signed)
CSN: [id number]     Arrival date & time 07/04/2014  9179 History   First MD Initiated Contact with Patient 06/28/2014 0710     Chief Complaint  Patient presents with  . Altered Mental Status  . Respiratory Distress    Level V caveat due to altered mental status  (Consider location/radiation/quality/duration/timing/severity/associated sxs/prior Treatment) Patient is a 64 y.o. female presenting with altered mental status. The history is provided by the patient and the EMS personnel.  Altered Mental Status  patient presents with altered mental status. Reportedly last night had been walking around somewhat confused. Found on the floor today. They have been hypoxic to start. Definite decreased mental status and hypotension. Minimal response to any stimulus. Tachycardic persistently for EMS. Reportedly has history of CHF and psychiatric disorders.  No past medical history on file. No past surgical history on file. No family history on file. History  Substance Use Topics  . Smoking status: Not on file  . Smokeless tobacco: Not on file  . Alcohol Use: Not on file   OB History    No data available     Review of Systems  Unable to perform ROS     Allergies  Abilify; Geodon; Niaspan; and Prednisone  Home Medications   Prior to Admission medications   Medication Sig Start Date End Date Taking? Authorizing Provider  albuterol (PROVENTIL) (2.5 MG/3ML) 0.083% nebulizer solution Take 3 mLs by nebulization every 4 (four) hours as needed for wheezing or shortness of breath.  04/18/14  Yes Historical Provider, MD  esomeprazole (NEXIUM) 40 MG capsule Take 40 mg by mouth 2 (two) times daily before a meal.   Yes Historical Provider, MD  estradiol (ESTRACE) 1 MG tablet Take 1 mg by mouth daily.   Yes Historical Provider, MD  fenofibrate (TRICOR) 145 MG tablet Take 145 mg by mouth daily.   Yes Historical Provider, MD  furosemide (LASIX) 40 MG tablet Take 20-40 mg by mouth daily as needed for edema.    Yes Historical Provider, MD  PROAIR HFA 108 (90 BASE) MCG/ACT inhaler Inhale 2 puffs into the lungs every 6 (six) hours as needed for wheezing or shortness of breath.  04/24/14  Yes Historical Provider, MD  risperiDONE (RISPERDAL) 2 MG tablet Take 2-4 mg by mouth 2 (two) times daily. Take 2 mg in the morning and 4 mg in the evening.   Yes Historical Provider, MD  simvastatin (ZOCOR) 20 MG tablet Take 20 mg by mouth daily at 6 PM.   Yes Historical Provider, MD  SYMBICORT 160-4.5 MCG/ACT inhaler Inhale 2 puffs into the lungs 2 (two) times daily. 05/16/14  Yes Historical Provider, MD   BP 101/71 mmHg  Pulse 88  Temp(Src) 97.8 F (36.6 C) (Core (Comment))  Resp 19  Ht 5\' 3"  (1.6 m)  Wt 190 lb (86.183 kg)  BMI 33.67 kg/m2  SpO2 96% Physical Exam  Constitutional: She appears well-developed.  HENT:  Head: Atraumatic.  Eyes:  Pupils around 3 mm and sluggish.  Neck: No JVD present.  Cardiovascular:  Tachycardia  Pulmonary/Chest:  Some transmitted upper airway sounds. Shallow respirations. Some diffuse rales that appeared worse on the right  Abdominal: She exhibits distension.  Musculoskeletal: She exhibits edema.  Chronic venous changes of bilateral lower extremities.  Neurological:  Minimally responsive to pain. Breathing spontaneously but shallow respirations  Skin: Skin is dry.  Chronic venous changes with erythema and some darkening of bilateral feet and lower legs. Erythema and some skin changes of groin area  without fluctuance or drainage.    ED Course  CARDIOVERSION Date/Time: 07/05/2014 9:00 AM Performed by: Davonna Belling Authorized by: Davonna Belling Consent: The procedure was performed in an emergent situation. Verbal consent not obtained. Written consent not obtained. Required items: required blood products, implants, devices, and special equipment available Time out: Immediately prior to procedure a "time out" was called to verify the correct patient, procedure,  equipment, support staff and site/side marked as required. Patient sedated: no (Patient comatose) Cardioversion basis: emergent Pre-procedure rhythm: atrial flutter Chest area: chest area exposed Electrodes: pads Electrodes placed: anterior-posterior Number of attempts: 1 Attempt 1 mode: synchronous Attempt 1 waveform: biphasic Attempt 1 shock (in Joules): 120 Attempt 1 outcome: conversion to normal sinus rhythm Post-procedure rhythm: normal sinus rhythm Complications: no complications Patient tolerance: Patient tolerated the procedure well with no immediate complications   (including critical care time) Labs Review Labs Reviewed  MRSA PCR SCREENING - Abnormal; Notable for the following:    MRSA by PCR POSITIVE (*)    All other components within normal limits  BRAIN NATRIURETIC PEPTIDE - Abnormal; Notable for the following:    B Natriuretic Peptide 357.3 (*)    All other components within normal limits  COMPREHENSIVE METABOLIC PANEL - Abnormal; Notable for the following:    Sodium 115 (*)    Chloride 83 (*)    CO2 18 (*)    Glucose, Bld 128 (*)    Calcium 8.3 (*)    Total Protein 5.5 (*)    Albumin 2.8 (*)    AST 73 (*)    Alkaline Phosphatase 200 (*)    Total Bilirubin 3.1 (*)    All other components within normal limits  CK - Abnormal; Notable for the following:    Total CK 844 (*)    All other components within normal limits  ACETAMINOPHEN LEVEL - Abnormal; Notable for the following:    Acetaminophen (Tylenol), Serum <10 (*)    All other components within normal limits  PROTIME-INR - Abnormal; Notable for the following:    Prothrombin Time 17.5 (*)    All other components within normal limits  CBC WITH DIFFERENTIAL/PLATELET - Abnormal; Notable for the following:    RBC 6.84 (*)    Hemoglobin 20.5 (*)    HCT 56.7 (*)    MCHC 36.2 (*)    RDW 18.0 (*)    Platelets 116 (*)    Neutrophils Relative % 89 (*)    Neutro Abs 9.2 (*)    Lymphocytes Relative 4 (*)     Lymphs Abs 0.4 (*)    All other components within normal limits  TROPONIN I - Abnormal; Notable for the following:    Troponin I 0.08 (*)    All other components within normal limits  TROPONIN I - Abnormal; Notable for the following:    Troponin I 0.09 (*)    All other components within normal limits  IRON AND TIBC - Abnormal; Notable for the following:    Saturation Ratios 42 (*)    All other components within normal limits  VITAMIN B12 - Abnormal; Notable for the following:    Vitamin B-12 1388 (*)    All other components within normal limits  URINALYSIS, ROUTINE W REFLEX MICROSCOPIC - Abnormal; Notable for the following:    Color, Urine AMBER (*)    APPearance TURBID (*)    Hgb urine dipstick MODERATE (*)    Bilirubin Urine MODERATE (*)    Ketones, ur 15 (*)  Protein, ur 100 (*)    Urobilinogen, UA 2.0 (*)    Leukocytes, UA SMALL (*)    All other components within normal limits  GLUCOSE, CAPILLARY - Abnormal; Notable for the following:    Glucose-Capillary 112 (*)    All other components within normal limits  URINE MICROSCOPIC-ADD ON - Abnormal; Notable for the following:    Squamous Epithelial / LPF MANY (*)    All other components within normal limits  I-STAT CG4 LACTIC ACID, ED - Abnormal; Notable for the following:    Lactic Acid, Venous 3.51 (*)    All other components within normal limits  I-STAT CHEM 8, ED - Abnormal; Notable for the following:    Sodium 116 (*)    Chloride 82 (*)    Glucose, Bld 132 (*)    Calcium, Ion 0.98 (*)    Hemoglobin 23.5 (*)    HCT 69.0 (*)    All other components within normal limits  I-STAT ARTERIAL BLOOD GAS, ED - Abnormal; Notable for the following:    pH, Arterial 7.219 (*)    pCO2 arterial 55.8 (*)    pO2, Arterial 131.0 (*)    Acid-base deficit 6.0 (*)    All other components within normal limits  CULTURE, BLOOD (ROUTINE X 2)  CULTURE, BLOOD (ROUTINE X 2)  URINE CULTURE  CULTURE, RESPIRATORY (NON-EXPECTORATED)  ETHANOL   URINE RAPID DRUG SCREEN (HOSP PERFORMED)  SALICYLATE LEVEL  NA AND K (SODIUM & POTASSIUM), RAND UR  CREATININE, URINE, RANDOM  TECHNOLOGIST SMEAR REVIEW  FERRITIN  CORTISOL  BLOOD GAS, ARTERIAL  DRUGS OF ABUSE SCREEN W ALC, ROUTINE URINE  TROPONIN I  OSMOLALITY, URINE  OSMOLALITY  PROCALCITONIN  ERYTHROPOIETIN  BLOOD GAS, ARTERIAL  BASIC METABOLIC PANEL  I-STAT CHEM 8, ED  I-STAT TROPOININ, ED  POCT URINE SPECIFIC GRAVITY, REFRACTOMETER  I-STAT CG4 LACTIC ACID, ED    Imaging Review Ct Head Wo Contrast  06/17/2014   CLINICAL DATA:  64 year old female with a history of altered mental status  EXAM: CT HEAD WITHOUT CONTRAST  TECHNIQUE: Contiguous axial images were obtained from the base of the skull through the vertex without intravenous contrast.  COMPARISON:  04/14/2013, 06/13/2010  FINDINGS: Unremarkable appearance of the calvarium without acute fracture or aggressive lesion.  Unremarkable appearance of the scalp soft tissues.  Unremarkable appearance of the bilateral orbits.  Endotracheal tube in place. Secretions in the oropharynx and nasopharynx.  Bilateral lens extraction.  Mastoid air cells are clear.  No significant paranasal sinus disease  No acute intracranial hemorrhage. No midline shift or mass effect. Hyperdense appearance of the anterior cerebral vasculature was present on the comparison CT, potentially reflecting atherosclerotic changes.  New confluent hypodensity in the left high parietal region extending from the white matter into the cortex.  Gray-white differentiation maintained.  IMPRESSION: No CT evidence of acute intracranial abnormality.  New hypodensity in the left parietal region extending through the cortex, not present on the comparison CT dated 04/14/2013. This is favored to represent an interval infarction, age indeterminate.  Intracranial atherosclerosis.  Signed,  Dulcy Fanny. Earleen Newport, DO  [facility]  [facility]    Electronically Signed   By: Corrie Mckusick D.O.   On: 06/23/2014 08:41   Dg Chest Port 1 View  06/18/2014   CLINICAL DATA:  Encounter for central line placement  EXAM: PORTABLE CHEST - 1 VIEW  COMPARISON:  06/29/2014 at 7:32 a.m.  FINDINGS: New right IJ central line, tip at the  lower SVC level. No pneumothorax or new mediastinal widening. The endotracheal tube remains in good position with tip at the clavicular heads. Orogastric tube enters the stomach at least.  Marked cardiopericardial enlargement with vascular pedicle widening and pulmonary venous congestion and edema. Bilateral layering effusions.  IMPRESSION: 1. New right IJ catheter with tip in good position. No pneumothorax. 2. CHF.   Electronically Signed   By: Monte Fantasia M.D.   On: 06/28/2014 09:28   Dg Chest Portable 1 View  06/13/2014   CLINICAL DATA:  64 year old female with a history of altered mental status  EXAM: PORTABLE CHEST - 1 VIEW  COMPARISON:  02/02/2013  FINDINGS: Cardiomediastinal silhouette likely unchanged. Significant right rotation the patient limits evaluation.  Fullness in the hilar regions.  Interlobular septal thickening with interstitial opacities.  Dense opacity at the right base obscures the right hemidiaphragm in the right heart border. Blunting of the right costophrenic angle.  Endotracheal tube terminates above the carina approximately 4 cm.  Gastric tube projects over the mediastinum.  IMPRESSION: Evidence of CHF, and likely right pleural effusion.  Endotracheal tube terminates suitably above the carina.  Gastric tube in place.  Signed,  Dulcy Fanny. Earleen Newport, DO  [facility]  [facility]   Electronically Signed   By: Corrie Mckusick D.O.   On: 06/27/2014 07:40     EKG Interpretation   Date/Time:  Friday Jun 14 2014 06:59:52 EDT Ventricular Rate:  145 PR Interval:    QRS Duration: 137 QT Interval:  292 QTC Calculation: 453 R Axis:   -141 Text Interpretation:  Atrial  flutter with 2 to 1 block Right bundle branch  block ST elevation, consider inferior injury Confirmed by Alvino Chapel  MD,  Leiani Enright [phone]) on 06/24/2014 7:28:02 AM      EKG Interpretation  Date/Time:  Friday Jun 14 2014 08:02:18 EDT Ventricular Rate:  102 PR Interval:  170 QRS Duration: 141 QT Interval:  384 QTC Calculation: 500 R Axis:   -146 Text Interpretation:  Sinus tachycardia Right bundle branch block Aflutter  now converted to  Sinus Confirmed by Alvino Chapel  MD, Ovid Curd [phone]) on 06/25/2014 8:23:33 AM        MDM   Final diagnoses:  Polycythemia  Polycythemia  Encounter for central line placement   diagnosis: Septic shock, polycythemia, atrial flutter with RVR, heart failure.  Patient presented hypotensive and reportedly had been hypoxic. Minimally responsive and required emergent intubation. Found to be tachycardic and was in atrial flutter with RVR. Cardioverted in the ER back to sinus rhythm. Zosyn and vancomycin started and fluid boluses given. Local care consulted. Admit to the ICU.  CRITICAL CARE Performed by: Mackie Pai Total critical care time: 40 Critical care time was exclusive of separately billable procedures and treating other patients. Critical care was necessary to treat or prevent imminent or life-threatening deterioration. Critical care was time spent personally by me on the following activities: development of treatment plan with patient and/or surrogate as well as nursing, discussions with consultants, evaluation of patient's response to treatment, examination of patient, obtaining history from patient or surrogate, ordering and performing treatments and interventions, ordering and review of laboratory studies, ordering and review of radiographic studies, pulse oximetry and re-evaluation of patient's condition.  INTUBATION Performed by: Mackie Pai  Required items: required blood products, implants, devices, and special equipment  available Patient identity confirmed: provided demographic data and hospital-assigned identification number Time out: Immediately prior to procedure a "time out" was called to verify  the correct patient, procedure, equipment, support staff and site/side marked as required.  Indications: Altered mental status/sepsis   Intubation method: Glidescope Laryngoscopy   Preoxygenation: BVM  Sedatives: Etomidate Paralytic: Roccuronium  Tube Size: 7.5 cuffed  Post-procedure assessment: chest rise and ETCO2 monitor Breath sounds: equal and absent over the epigastrium Tube secured with: ETT holder Chest x-ray interpreted by radiologist and me.  Chest x-ray findings: endotracheal tube in appropriate position  Patient tolerated the procedure well with no immediate complications.  Somewhat difficult intubation with the focal cords somewhat tight to the tube on insertion            Davonna Belling, MD 06/13/2014 [phone]

## 2014-06-14 NOTE — H&P (Addendum)
PULMONARY / CRITICAL CARE MEDICINE   Name: Ana Hampton MRN: [id number] DOB: 01-11-1951    ADMISSION DATE:  06/28/2014 CONSULTATION DATE:  06/27/2014  REFERRING MD :  [name]  CHIEF COMPLAINT:  Unresponsive  INITIAL PRESENTATION: 64 year old with extensive psych history but otherwise not much is known who was acting different the day prior to presentation, family did not think much of it.  They went to sleep woke up this AM and found her unresponsive on the floor.  EMS was called and patient was brought to the ED.  She was not protecting her airway and [name] intubated patient.  PCCM was called to admit.  Little other history is available and no family bedside.  Patient was hypothermic on presentation, now febrile.  STUDIES:  CXR 5/6 ETT ok and pulmonary edema Head CT 5/6 pending  SIGNIFICANT EVENTS: 5/6 unresponsive, to ED and intubated.  HISTORY OF PRESENT ILLNESS:  64 year old with extensive psych history but otherwise not much is known who was acting different the day prior to presentation, family did not think much of it.  They went to sleep woke up this AM and found her unresponsive on the floor.  EMS was called and patient was brought to the ED.  She was not protecting her airway and [name] intubated patient.  PCCM was called to admit.  Little other history is available and no family bedside.  PAST MEDICAL HISTORY :   has no past medical history on file.  has no past surgical history on file. Prior to Admission medications   Medication Sig Start Date End Date Taking? Authorizing Provider  esomeprazole (NEXIUM) 40 MG capsule Take 40 mg by mouth 2 (two) times daily before a meal.   Yes Historical Provider, MD  estradiol (ESTRACE) 1 MG tablet Take 1 mg by mouth daily.   Yes Historical Provider, MD  fenofibrate (TRICOR) 145 MG tablet Take 145 mg by mouth daily.   Yes Historical Provider, MD  furosemide (LASIX) 40 MG tablet Take 20-40 mg by mouth daily as needed for edema.   Yes Historical  Provider, MD  risperiDONE (RISPERDAL) 2 MG tablet Take 2-4 mg by mouth 2 (two) times daily. Take 2 mg in the morning and 4 mg in the evening.   Yes Historical Provider, MD  simvastatin (ZOCOR) 20 MG tablet Take 20 mg by mouth daily at 6 PM.   Yes Historical Provider, MD   No Known Allergies  FAMILY HISTORY:  has no family status information on file.  SOCIAL HISTORY:    REVIEW OF SYSTEMS:  Unattainable, patient is completely unresponsive.  SUBJECTIVE:   VITAL SIGNS: Temp:  [96.4 F (35.8 C)-100.4 F (38 C)] 100.4 F (38 C) (05/06 0805) Pulse Rate:  [105-146] 105 (05/06 0805) Resp:  [17-36] 22 (05/06 0805) BP: (71-114)/(45-78) 87/65 mmHg (05/06 0805) SpO2:  [88 %-100 %] 100 % (05/06 0805) FiO2 (%):  [100 %] 100 % (05/06 0721) HEMODYNAMICS:   VENTILATOR SETTINGS: Vent Mode:  [-] PRVC FiO2 (%):  [100 %] 100 % Set Rate:  [17 bmp] 17 bmp Vt Set:  [470 mL] 470 mL PEEP:  [5 cmH20] 5 cmH20 INTAKE / OUTPUT:  Intake/Output Summary (Last 24 hours) at 06/15/2014 0816 Last data filed at 07/04/2014 0757  Gross per 24 hour  Intake     20 ml  Output      0 ml  Net     20 ml    PHYSICAL EXAMINATION: General:  Chronically ill  appearing female, completely unresponsive. Neuro:  Unresponsive, does not withdraw to pain, has a respiratory drive, pupils are non-reactive and no corneal reflexes, scleral edema. HEENT:  [place]/AT, PERRL, EOM-I and MMM. Cardiovascular:  RRR, Nl S1/S2, -M/R/G (post cardioversion). Lungs:  Diffuse crackles in all lung fields. Abdomen:  Soft, NT, ND and +BS. Musculoskeletal:  Severe erythema diffusely, bilateral lower ext edema and venous stasis but pulses are weak and present. Skin:  Diffuse erythema, groin with erythema as well.  LABS:  CBC  Recent Labs Lab 06/30/2014 0747  HGB 23.5*  HCT 69.0*   Coag's No results for input(s): APTT, INR in the last 168 hours. BMET  Recent Labs Lab 06/30/2014 0747  NA 116*  K 4.5  CL 82*  BUN 17  CREATININE 0.80   GLUCOSE 132*   Electrolytes No results for input(s): CALCIUM, MG, PHOS in the last 168 hours. Sepsis Markers  Recent Labs Lab 06/10/2014 0747  LATICACIDVEN 3.51*   ABG No results for input(s): PHART, PCO2ART, PO2ART in the last 168 hours. Liver Enzymes No results for input(s): AST, ALT, ALKPHOS, BILITOT, ALBUMIN in the last 168 hours. Cardiac Enzymes No results for input(s): TROPONINI, PROBNP in the last 168 hours. Glucose No results for input(s): GLUCAP in the last 168 hours.  Imaging No results found.   ASSESSMENT / PLAN:  PULMONARY OETT 5/6>>> A: VDRF due to inability to protect her airway, cause of AMS unknown at this time. P:   - Full vent support. - ABG now and correct vent as indicated. - Titrate O2 for sats. - See ID section.  CARDIOVASCULAR CVL L IJ TLC 5/6>>> A: Hypotension, whether cardiac versus septic is unclear at this point but more likely septic since patient is now febrile. A-fib with associated hypotension that [name] cardioverted. P:  - IVF resuscitation. - Place TLC. - Follow CVP. - EKG noted. - 2D echo as ordered. - Trend troponins. - If head CT is negative then SQ heparin.  RENAL A:  Severe hyponatremia and hypochloremia, cause unknown. P:   - NS resuscitation, target rise of Na no more than 0.5 meq an hour, will start with NS at 150 ml/hr. - Send urine Na, K and Cr. - Urine and serum osmolality. - Urine specific gravity. - BMET in AM. - BMET q12 x4. - Replace electrolytes as indicated.  GASTROINTESTINAL A:  No active issues. P:   - TF per nutrition. - Protonix.  HEMATOLOGIC A:  Hg of 23.5 and Hct of 69. P:  - Will perform a peripheral smear. - Repeat H/H to confirm. - Ferritin, iron and TIBC sent. - Called H/O to evaluate for phlebotomize patient. - Phlebotomize one unit per H/O recommendations. - Abdominal U/S for renal or hepato cellular carcinoma. - Erythropoietin level. - ASA since head CT is  negative.  INFECTIOUS A:  Febrile, CXR suspicious PNA, urine pending. P:   BCx2 5/6>>> UC 5/6>>> Sputum 5/6>>> Abx: Vanc/zosyn 5/6>>>  - F/U on cultures. - PCT protocol. - Narrow as data arrives.  ENDOCRINE A:  No diabetes by history.  Now in shock.   P:   - Check cortisol level. - Stress dose steroids.  NEUROLOGIC A:  Completely unresponsive, reason unknown. P:   RASS goal: N/A, patient is on no sedation. - Head CT negative. - Start ASA for polycythemia vera. - Neurology consult called.  Decreased pulses in bilateral lower ext.  - Lower ext arterial dopplers ordered.  - Will f/u.  FAMILY  - Updates: NO family  bedside.  Case discussed with H/O and neurology,   The patient is critically ill with multiple organ systems failure and requires high complexity decision making for assessment and support, frequent evaluation and titration of therapies, application of advanced monitoring technologies and extensive interpretation of multiple databases.   Critical Care Time devoted to patient care services described in this note is  105  Minutes. This time reflects time of care of this signee Dr Jennet Maduro. This critical care time does not reflect procedure time, or teaching time or supervisory time of PA/NP/Med student/Med Resident etc but could involve care discussion time.  Rush Farmer, M.D. [facility] Pulmonary/Critical Care Medicine. Pager: [phone]. After hours pager: [phone].  06/22/2014, 8:16 AM

## 2014-06-14 NOTE — Consult Note (Signed)
Reason for Consult:Unresponsive Referring Physician: Nelda Marseille  CC: Unresponsive  HPI: Ana Hampton is an 64 y.o. female who is intubated and unable to provide any history at this time.  History obtained from the chart.  It appears that the patient was walking around confused last night.  Today she was found on the floor.  Was hypoxic, hypothermic and hypotensive.  Tachycardic per EMS.  Afib noted in ED but cardioverted.  Minimal response. Required intubation.  Initial PCCM exam shows unreactive pupils, absent corneals, not breathing above the ventilator.    Past medical history: Schizophrenia, depression, IBS, CAD, Barrett esophagus, Acid reflux, Hyperlipidemia, HTN, Cataract, COPD,   Past surgical history: Tonsillectomy, Hysterectomy, Cataract removal, Cholecystectomy  Family history: Unable to obtain  Social History:  Smokes 2 ppd of cigarettes.  No history of alcohol abuse.    Allergies: Abilify, Geodon, Niaspan, Prednisone  Medications:  I have reviewed the patient's current medications. Prior to Admission:  Prescriptions prior to admission  Medication Sig Dispense Refill Last Dose  . albuterol (PROVENTIL) (2.5 MG/3ML) 0.083% nebulizer solution Take 3 mLs by nebulization every 4 (four) hours as needed for wheezing or shortness of breath.   11 unknown  . esomeprazole (NEXIUM) 40 MG capsule Take 40 mg by mouth 2 (two) times daily before a meal.   unknown  . estradiol (ESTRACE) 1 MG tablet Take 1 mg by mouth daily.   unknown  . fenofibrate (TRICOR) 145 MG tablet Take 145 mg by mouth daily.   unknown  . furosemide (LASIX) 40 MG tablet Take 20-40 mg by mouth daily as needed for edema.   unknown  . PROAIR HFA 108 (90 BASE) MCG/ACT inhaler Inhale 2 puffs into the lungs every 6 (six) hours as needed for wheezing or shortness of breath.   5 unknown  . risperiDONE (RISPERDAL) 2 MG tablet Take 2-4 mg by mouth 2 (two) times daily. Take 2 mg in the morning and 4 mg in the evening.   unknown  .  simvastatin (ZOCOR) 20 MG tablet Take 20 mg by mouth daily at 6 PM.   unknown  . SYMBICORT 160-4.5 MCG/ACT inhaler Inhale 2 puffs into the lungs 2 (two) times daily.  5 unknown   Scheduled: . aspirin  81 mg Oral Daily  . EPINEPHrine  0.5 mg Intravenous Once  . feeding supplement (PRO-STAT SUGAR FREE 64)  30 mL Per Tube BID  . feeding supplement (VITAL HIGH PROTEIN)  1,000 mL Per Tube Q24H  . hydrocerin   Topical BID  . hydrocortisone sodium succinate  50 mg Intravenous Q6H  . mupirocin cream   Topical BID  . pantoprazole (PROTONIX) IV  40 mg Intravenous Q24H  . piperacillin-tazobactam (ZOSYN)  IV  3.375 g Intravenous 3 times per day  . vancomycin  1,000 mg Intravenous Q12H    ROS: Unable to obtain  Physical Examination: Blood pressure 82/63, pulse 99, temperature 97.4 F (36.3 C), temperature source Oral, resp. rate 16, height 5\' 3"  (1.6 m), weight 86.183 kg (190 lb), SpO2 97 %.  HEENT-  Normocephalic, no lesions, without obvious abnormality.  Normal external eye and conjunctiva.  Normal TM's bilaterally.  Normal auditory canals and external ears. Normal external nose, mucus membranes and septum.  Normal pharynx. Cardiovascular- S1, S2 normal, pulses not palpable in the lower extremities   Lungs- chest clear, no wheezing, rales, normal symmetric air entry Abdomen- soft, non-tender; bowel sounds normal; no masses,  no organomegaly Extremities- erythmatous legs below the knees.  Cold Lymph-no  adenopathy palpable Musculoskeletal-no joint tenderness, deformity or swelling Skin-open lesions on legs  Neurological Examination Mental Status: Patient does not respond to verbal stimuli.  Localizes to deep sternal rub with upper extremities (right greater than left).  Does not follow commands.  No verbalizations noted.  Cranial Nerves: II: patient does not respond confrontation bilaterally, pupils right 3 mm, left 3 mm,and reactive bilaterally III,IV,VI: doll's response absent bilaterally.   V,VII: corneal reflex present bilaterally  VIII: patient does not respond to verbal stimuli IX,X: gag reflex reduced, XI: trapezius strength unable to test bilaterally XII: tongue strength unable to test Motor: Movements noted of both upper and lower extremities. Lower extremity movement is minimal Sensory: Does not respond to noxious stimuli in any extremity. Deep Tendon Reflexes:  2+ in the upper extremities, absent in the lower extremities Plantars: upgoing bilaterally Cerebellar: Unable to perform    Laboratory Studies:   Basic Metabolic Panel:  Recent Labs Lab 07/06/2014 0728 06/25/2014 0747  NA 115* 116*  K 5.0 4.5  CL 83* 82*  CO2 18*  --   GLUCOSE 128* 132*  BUN 13 17  CREATININE 0.84 0.80  CALCIUM 8.3*  --     Liver Function Tests:  Recent Labs Lab 07/01/2014 0728  AST 73*  ALT 36  ALKPHOS 200*  BILITOT 3.1*  PROT 5.5*  ALBUMIN 2.8*   No results for input(s): LIPASE, AMYLASE in the last 168 hours. No results for input(s): AMMONIA in the last 168 hours.  CBC:  Recent Labs Lab 07/05/2014 0728 07/07/2014 0747  WBC 10.3  --   NEUTROABS 9.2*  --   HGB 20.5* 23.5*  HCT 56.7* 69.0*  MCV 82.9  --   PLT 116*  --     Cardiac Enzymes:  Recent Labs Lab 06/23/2014 0728  CKTOTAL 844*    BNP: Invalid input(s): POCBNP  CBG:  Recent Labs Lab 07/04/2014 [place]    Microbiology: No results found for this or any previous visit.  Coagulation Studies:  Recent Labs  06/21/2014 0728  LABPROT 17.5*  INR 1.42    Urinalysis: No results for input(s): COLORURINE, LABSPEC, PHURINE, GLUCOSEU, HGBUR, BILIRUBINUR, KETONESUR, PROTEINUR, UROBILINOGEN, NITRITE, LEUKOCYTESUR in the last 168 hours.  Invalid input(s): APPERANCEUR  Lipid Panel:  No results found for: CHOL, TRIG, HDL, CHOLHDL, VLDL, LDLCALC  HgbA1C: No results found for: HGBA1C  Urine Drug Screen:     Component Value Date/Time   LABOPIA NONE DETECTED 06/25/2014 0746   COCAINSCRNUR  NONE DETECTED 06/29/2014 0746   LABBENZ NONE DETECTED 06/13/2014 0746   AMPHETMU NONE DETECTED 07/05/2014 0746   THCU NONE DETECTED 07/06/2014 0746   LABBARB NONE DETECTED 06/19/2014 0746    Alcohol Level:  Recent Labs Lab 06/09/2014 0728  ETH <5    Other results: EKG: sinus tachycardia at 102 bpm, RBBB.  Imaging: Ct Head Wo Contrast  06/10/2014   CLINICAL DATA:  64 year old female with a history of altered mental status  EXAM: CT HEAD WITHOUT CONTRAST  TECHNIQUE: Contiguous axial images were obtained from the base of the skull through the vertex without intravenous contrast.  COMPARISON:  04/14/2013, 06/13/2010  FINDINGS: Unremarkable appearance of the calvarium without acute fracture or aggressive lesion.  Unremarkable appearance of the scalp soft tissues.  Unremarkable appearance of the bilateral orbits.  Endotracheal tube in place. Secretions in the oropharynx and nasopharynx.  Bilateral lens extraction.  Mastoid air cells are clear.  No significant paranasal sinus disease  No acute intracranial hemorrhage. No midline  shift or mass effect. Hyperdense appearance of the anterior cerebral vasculature was present on the comparison CT, potentially reflecting atherosclerotic changes.  New confluent hypodensity in the left high parietal region extending from the white matter into the cortex.  Gray-white differentiation maintained.  IMPRESSION: No CT evidence of acute intracranial abnormality.  New hypodensity in the left parietal region extending through the cortex, not present on the comparison CT dated 04/14/2013. This is favored to represent an interval infarction, age indeterminate.  Intracranial atherosclerosis.  Signed,  Dulcy Fanny. Earleen Newport, DO  [facility]  [facility]   Electronically Signed   By: Corrie Mckusick D.O.   On: 06/25/2014 08:41   Dg Chest Port 1 View  06/13/2014   CLINICAL DATA:  Encounter for central line placement  EXAM: PORTABLE CHEST - 1  VIEW  COMPARISON:  06/25/2014 at 7:32 a.m.  FINDINGS: New right IJ central line, tip at the lower SVC level. No pneumothorax or new mediastinal widening. The endotracheal tube remains in good position with tip at the clavicular heads. Orogastric tube enters the stomach at least.  Marked cardiopericardial enlargement with vascular pedicle widening and pulmonary venous congestion and edema. Bilateral layering effusions.  IMPRESSION: 1. New right IJ catheter with tip in good position. No pneumothorax. 2. CHF.   Electronically Signed   By: Monte Fantasia M.D.   On: 06/13/2014 09:28   Dg Chest Portable 1 View  06/25/2014   CLINICAL DATA:  64 year old female with a history of altered mental status  EXAM: PORTABLE CHEST - 1 VIEW  COMPARISON:  02/02/2013  FINDINGS: Cardiomediastinal silhouette likely unchanged. Significant right rotation the patient limits evaluation.  Fullness in the hilar regions.  Interlobular septal thickening with interstitial opacities.  Dense opacity at the right base obscures the right hemidiaphragm in the right heart border. Blunting of the right costophrenic angle.  Endotracheal tube terminates above the carina approximately 4 cm.  Gastric tube projects over the mediastinum.  IMPRESSION: Evidence of CHF, and likely right pleural effusion.  Endotracheal tube terminates suitably above the carina.  Gastric tube in place.  Signed,  Dulcy Fanny. Earleen Newport, DO  [facility]  [facility]   Electronically Signed   By: Corrie Mckusick D.O.   On: 06/20/2014 07:40     Assessment/Plan: 64 year old female presenting unresponsive.  Has more responses at this time than on initial presentation.  Concern is for an encephalopathy related to sepsis but patient with afib on presentation that required cardioversion.  Can not rule out acute infarct.  Head CT personally reviewed and shows a new left parietal hypodensity.  Can not rule out acute infarct.  Although this does  not appear to be large enough to cause current presentation.  Can not rule out seizure associated with stroke.  Multiple metabolic issues noted as well-hyponatremia, hypochloremia.    Recommendations: 1.  MRI of the brain without contrast 2.  ASA daily 3.  EEG 4.  Agree with correction of metabolic issues.   5.  Telemetry 6.  Frequent neuro checks   Alexis Goodell, MD [facility] [phone] 06/20/2014, 1:43 PM

## 2014-06-14 NOTE — Procedures (Signed)
Central Venous Catheter Insertion Procedure Note Ana Hampton [id number] [birth year]  Procedure: Insertion of Central Venous Catheter Indications: Assessment of intravascular volume, Drug and/or fluid administration and Frequent blood sampling  Procedure Details Consent: Unable to obtain consent because of altered level of consciousness. Time Out: Verified patient identification, verified procedure, site/side was marked, verified correct patient position, special equipment/implants available, medications/allergies/relevent history reviewed, required imaging and test results available.  Performed Real time [place] was used to ID and cannulate the vessel.  Maximum sterile technique was used including antiseptics, cap, gloves, gown, hand hygiene, mask and sheet. Skin prep: Chlorhexidine; local anesthetic administered A antimicrobial bonded/coated triple lumen catheter was placed in the right internal jugular vein using the Seldinger technique.  Evaluation Blood flow good Complications: No apparent complications Patient did tolerate procedure well. Chest X-ray ordered to verify placement.  CXR: pending.  BABCOCK,PETE 07/06/2014, 9:09 AM

## 2014-06-14 NOTE — Care Management Note (Signed)
Case Management Note  Patient Details  Name: KAYDAN WILHOITE MRN: [id number] Date of Birth: 18-Dec-1950  Subjective/Objective:   On vent, fam found unresponsive                 Action/Plan:lives at home pta   Expected Discharge Date:                  Expected Discharge Plan:  Home/Self Care  In-House Referral:  Clinical Social Work  Discharge planning Services     Post Acute Care Choice:    Choice offered to:     DME Arranged:    DME Agency:     HH Arranged:    [employer] Agency:     Status of Service:     Medicare Important Message Given:    Date Medicare IM Given:    Medicare IM give by:    Date Additional Medicare IM Given:    Additional Medicare Important Message give by:     If discussed at [place] of Stay Meetings, dates discussed:    Additional Comments:  Lacretia Leigh, RN 07/07/2014, 11:27 AM

## 2014-06-14 NOTE — Progress Notes (Signed)
EEG completed, results pending. 

## 2014-06-14 NOTE — Consult Note (Addendum)
WOC wound consult note Reason for Consult: Consult requested for multiple wounds.  Pt was found down for unknown period of time prior to admission. Wound type: Buttocks with dark purple deep tissue injury; darker colored in some areas and location extends bilaterally 8X10cm.  One partial thickness round wound of unknown etiology located in the middle of the right buttock, .2X.2X.2cm, yellow dry woundbed. Right elbow with partial thickness skin tear, approx 3X3X.1cm, dark red wound bed with minimal amt pink drainage, no odor, loose peeling skin surrounding site. Inner groin and perineum dark red, macerated, with generalized edema and mod amt yellow drainage, no odor. Appearance consistent with intertrigo.   Right abd with dark purple deep tissue injury in a linear shape; pt may have been lying against an object which caused prolonged pressure to this site. Approx 4X2cm Left leg dry scaly peeling skin from calf to foot.   Left anterior foot with full thickness wound, 1X1X.2cm, 100% yellow moist wound bed with small amt yellow drainage. Bilat legs dark purple from upper calves to feet, cold to touch.  Topical treatment will not be effective, please consider a vascular study to assess perfusion and also to R/O DVT Pressure Ulcer POA: Yes Plan: Pt is on a Sport low-airloss bed to reduce pressure.  Interdry silver-impregnated fabric to inner groin and lower abd folds to wick drainage away from skin and provide antimicrobial benefits.  Bactroban to promote moist healing to left foot wound.  Eucerin to treat dry peeling skin. Foam dressings to buttocks, right elbow, and right leg if it begins to have drainage.  Pt is intubated and not very responsive, no family present to discuss plan of care.  Deep tissue injures are high risk to evolve into full thickness skin loss despite optimal plan of care. Please re-consult if further assistance is needed.  Thank-you,  Julien Girt MSN, [place], [place] [employer] [place], [place],  [place]

## 2014-06-14 NOTE — Progress Notes (Signed)
VASCULAR LAB PRELIMINARY  ARTERIAL  ABI completed:    RIGHT    LEFT    PRESSURE WAVEFORM  PRESSURE WAVEFORM  BRACHIAL  triphasic BRACHIAL 89 triphasic  DP   DP    AT  absent AT  absent  PT  absent PT  absent  PER   PER    GREAT TOE  78 GREAT TOE  absent    RIGHT LEFT  ABI NA due to absent pedal pulses, TBI 0.88 NA due to absent pedal pulses     Guinevere Ferrari, RVT 06/20/2014, 1:24 PM

## 2014-06-14 NOTE — ED Notes (Signed)
Dr

## 2014-06-14 NOTE — Procedures (Signed)
Arterial Catheter Insertion Procedure Note Ana Hampton [id number] [birth year]  Procedure: Insertion of Arterial Catheter  Indications: Blood pressure monitoring and Frequent blood sampling  Procedure Details Consent: Unable to obtain consent because of altered level of consciousness. Time Out: Verified patient identification, verified procedure, site/side was marked, verified correct patient position, special equipment/implants available, medications/allergies/relevent history reviewed, required imaging and test results available.  Performed Real time [place] was used to ID and cannulate the vessel Maximum sterile technique was used including antiseptics, cap, gloves, gown, hand hygiene and mask. Skin prep: Chlorhexidine; local anesthetic administered 20 gauge catheter was inserted into right radial artery using the Seldinger technique.  Evaluation Blood flow good; BP tracing good. Complications: No apparent complications.   Sigismund Cross,PETE 07/04/2014

## 2014-06-14 NOTE — Progress Notes (Addendum)
ANTIBIOTIC CONSULT NOTE - INITIAL  Pharmacy Consult for vancomycin and Zosyn Indication: sepsis  No Known Allergies  Patient Measurements: Estimated weight ~86 kg  Vital Signs: Temp: 99.7 F (37.6 C) (05/06 0851) Temp Source: Core (Comment) (05/06 0746) BP: 84/64 mmHg (05/06 0846) Pulse Rate: 107 (05/06 0851) Intake/Output from previous day:   Intake/Output from this shift: Total I/O In: 20 [Other:20] Out: -   Labs:  Recent Labs  06/27/2014 0728 07/06/2014 0747  WBC 10.3  --   HGB 20.5* 23.5*  PLT PENDING  --   CREATININE 0.84 0.80   CrCl cannot be calculated (Unknown ideal weight.). No results for input(s): VANCOTROUGH, VANCOPEAK, VANCORANDOM, GENTTROUGH, GENTPEAK, GENTRANDOM, TOBRATROUGH, TOBRAPEAK, TOBRARND, AMIKACINPEAK, AMIKACINTROU, AMIKACIN in the last 72 hours.   Microbiology: No results found for this or any previous visit (from the past 720 hour(s)).  Medical History: No past medical history on file.  Medications:  See electronic PTA med list  Assessment: 64 y/o female with extensive psych history who was acting different prior to presentation today. She was found unresponsive this morning and brought to the ED. Pharmacy consulted to begin vancomycin and Zosyn for sepsis with suspicion for PNA. Estimated normalized CrCl ~83 ml/min. Tmax 100.6, WBC are normal, lactic acid is 3.51. Vancomycin 2000 mg IV given at 08:00 and Zosyn 3.375 g given at 07:53.  Goal of Therapy:  Vancomycin trough level 15-20 mcg/ml Eradication of infection  Plan:  - Vancomycin 1000 mg IV q12h - Zosyn 3.375 g IV q8h to be infused over 4 hours - Monitor renal function, clinical progress, and culture data - Follow-up actual weight and height to ensure proper dosing for antibiotics  [facility], Pharm.D., BCPS Clinical Pharmacist Pager: [phone] 07/05/2014 9:04 AM

## 2014-06-14 NOTE — Procedures (Signed)
ELECTROENCEPHALOGRAM REPORT  Date of Study: 06/22/2014  Patient's Name: Ana Hampton MRN: [id number] Date of Birth: 1950-05-30  Referring Provider: Dr. Alexis Goodell  Clinical History: This is a 64 year old woman with confusion, found hypoxic, hypothermic and hypotensive, intubated for airway protection. Per notes, not on any sedating medications.  Medications: aspirin chewable tablet 81 mg hydrocortisone sodium succinate (SOLU-CORTEF) 100 MG injection 50 mg norepinephrine (LEVOPHED) 4 mg in dextrose 5 % 250 mL (0.016 mg/mL) infusion pantoprazole (PROTONIX) injection 40 mg piperacillin-tazobactam (ZOSYN) IVPB 3.375 g vancomycin (VANCOCIN) IVPB 1000 mg/200 mL premix  Technical Summary: A multichannel digital EEG recording measured by the international 10-20 system with electrodes applied with paste and impedances below 5000 ohms performed as portable with EKG monitoring in an intubated and unresponsive patient.  Hyperventilation and photic stimulation were not performed.  The digital EEG was referentially recorded, reformatted, and digitally filtered in a variety of bipolar and referential montages for optimal display.   Description: The patient is intubated and unresponsive during the recording. Per notes, she is not on any sedating medication. There is no clear posterior dominant rhythm. The background consists of a large amount of diffuse theta and delta slowing with brief 1-second periods of relative diffuse suppression of the background. Poorly formed vertex waves and sleep spindles are seen. With noxious stimulation, there is a slight  increase in faster frequencies and muscle artifact noted. There were no epileptiform discharges or electrographic seizures seen.    EKG lead was unremarkable.  Impression: This EEG is abnormal due to the presence moderate diffuse slowing of the background with 1-second periods of relative diffuse suppression.  Clinical Correlation of the above  findings indicates diffuse cerebral dysfunction that is non-specific in etiology and can be seen with hypoxic/ischemic injury, toxic/metabolic encephalopathies, or medication effect.  There were no epileptiform discharges or electrographic seizrues seen in this study.   Ellouise Newer, M.D.

## 2014-06-14 NOTE — Progress Notes (Signed)
*  PRELIMINARY RESULTS* Echocardiogram 2D Echocardiogram has been performed.  Leavy Cella 06/20/2014, 2:20 PM

## 2014-06-15 ENCOUNTER — Inpatient Hospital Stay (HOSPITAL_COMMUNITY): Payer: Medicare Other

## 2014-06-15 DIAGNOSIS — J9600 Acute respiratory failure, unspecified whether with hypoxia or hypercapnia: Secondary | ICD-10-CM

## 2014-06-15 DIAGNOSIS — R410 Disorientation, unspecified: Secondary | ICD-10-CM

## 2014-06-15 DIAGNOSIS — R930 Abnormal findings on diagnostic imaging of skull and head, not elsewhere classified: Secondary | ICD-10-CM

## 2014-06-15 LAB — BLOOD GAS, ARTERIAL
Acid-base deficit: 2.8 mmol/L — ABNORMAL HIGH (ref 0.0–2.0)
Bicarbonate: 22.6 mEq/L (ref 20.0–24.0)
FIO2: 0.5 %
LHR: 18 {breaths}/min
O2 Saturation: 96.2 %
PATIENT TEMPERATURE: 98.6
PEEP: 5 cmH2O
TCO2: 24.1 mmol/L (ref 0–100)
VT: 470 mL
pCO2 arterial: 47.6 mmHg — ABNORMAL HIGH (ref 35.0–45.0)
pH, Arterial: 7.299 — ABNORMAL LOW (ref 7.350–7.450)
pO2, Arterial: 93.4 mmHg (ref 80.0–100.0)

## 2014-06-15 LAB — BASIC METABOLIC PANEL
ANION GAP: 8 (ref 5–15)
Anion gap: 10 (ref 5–15)
Anion gap: 6 (ref 5–15)
BUN: 20 mg/dL (ref 6–20)
BUN: 24 mg/dL — AB (ref 6–20)
BUN: 27 mg/dL — ABNORMAL HIGH (ref 6–20)
CALCIUM: 7.8 mg/dL — AB (ref 8.9–10.3)
CALCIUM: 7.9 mg/dL — AB (ref 8.9–10.3)
CHLORIDE: 88 mmol/L — AB (ref 101–111)
CO2: 21 mmol/L — ABNORMAL LOW (ref 22–32)
CO2: 22 mmol/L (ref 22–32)
CO2: 19 mmol/L — ABNORMAL LOW (ref 22–32)
CREATININE: 1.74 mg/dL — AB (ref 0.44–1.00)
Calcium: 7.6 mg/dL — ABNORMAL LOW (ref 8.9–10.3)
Chloride: 86 mmol/L — ABNORMAL LOW (ref 101–111)
Chloride: 89 mmol/L — ABNORMAL LOW (ref 101–111)
Creatinine, Ser: 1.45 mg/dL — ABNORMAL HIGH (ref 0.44–1.00)
Creatinine, Ser: 1.67 mg/dL — ABNORMAL HIGH (ref 0.44–1.00)
GFR calc Af Amer: 44 mL/min — ABNORMAL LOW (ref >60)
GFR calc Af Amer: 35 mL/min — ABNORMAL LOW (ref >60)
GFR calc non Af Amer: 38 mL/min — ABNORMAL LOW (ref >60)
GFR, EST AFRICAN AMERICAN: 37 mL/min — AB (ref >60)
GFR, EST NON AFRICAN AMERICAN: 32 mL/min — AB (ref >60)
GFR, EST NON AFRICAN AMERICAN: 30 mL/min — AB (ref >60)
GLUCOSE: 165 mg/dL — AB (ref 70–99)
Glucose, Bld: 159 mg/dL — ABNORMAL HIGH (ref 70–99)
Glucose, Bld: 132 mg/dL — ABNORMAL HIGH (ref 70–99)
POTASSIUM: 3.8 mmol/L (ref 3.5–5.1)
Potassium: 4.1 mmol/L (ref 3.5–5.1)
Potassium: 4.4 mmol/L (ref 3.5–5.1)
Sodium: 117 mmol/L — CL (ref 135–145)
Sodium: 118 mmol/L — CL (ref 135–145)
Sodium: 114 mmol/L — CL (ref 135–145)

## 2014-06-15 LAB — GLUCOSE, CAPILLARY
GLUCOSE-CAPILLARY: 180 mg/dL — AB (ref 70–99)
GLUCOSE-CAPILLARY: 148 mg/dL — AB (ref 70–99)
GLUCOSE-CAPILLARY: 157 mg/dL — AB (ref 70–99)
Glucose-Capillary: 163 mg/dL — ABNORMAL HIGH (ref 70–99)
Glucose-Capillary: 165 mg/dL — ABNORMAL HIGH (ref 70–99)
Glucose-Capillary: 184 mg/dL — ABNORMAL HIGH (ref 70–99)
Glucose-Capillary: 186 mg/dL — ABNORMAL HIGH (ref 70–99)

## 2014-06-15 LAB — URINE CULTURE
Colony Count: NO GROWTH
Culture: NO GROWTH
Special Requests: NORMAL

## 2014-06-15 LAB — CBC
HCT: 54.1 % — ABNORMAL HIGH (ref 36.0–46.0)
Hemoglobin: 19.3 g/dL — ABNORMAL HIGH (ref 12.0–15.0)
MCH: 29.4 pg (ref 26.0–34.0)
MCHC: 35.7 g/dL (ref 30.0–36.0)
MCV: 82.5 fL (ref 78.0–100.0)
Platelets: 121 10*3/uL — ABNORMAL LOW (ref 150–400)
RBC: 6.56 MIL/uL — ABNORMAL HIGH (ref 3.87–5.11)
RDW: 18.2 % — AB (ref 11.5–15.5)
WBC: 12.4 10*3/uL — ABNORMAL HIGH (ref 4.0–10.5)

## 2014-06-15 LAB — VANCOMYCIN, TROUGH: Vancomycin Tr: 30 ug/mL (ref 10.0–20.0)

## 2014-06-15 LAB — PROCALCITONIN: Procalcitonin: 1.93 ng/mL

## 2014-06-15 LAB — PHOSPHORUS: Phosphorus: 5.6 mg/dL — ABNORMAL HIGH (ref 2.5–4.6)

## 2014-06-15 LAB — LACTIC ACID, PLASMA: Lactic Acid, Venous: 1.5 mmol/L (ref 0.5–2.0)

## 2014-06-15 LAB — MAGNESIUM: Magnesium: 1.8 mg/dL (ref 1.7–2.4)

## 2014-06-15 LAB — TSH: TSH: 5.063 u[IU]/mL — ABNORMAL HIGH (ref 0.350–4.500)

## 2014-06-15 MED ORDER — HEPARIN SODIUM (PORCINE) 5000 UNIT/ML IJ SOLN
5000.0000 [IU] | Freq: Three times a day (TID) | INTRAMUSCULAR | Status: DC
  Administered 2014-06-15 – 2014-06-19 (×12): 5000 [IU] via SUBCUTANEOUS
  Filled 2014-06-15 (×13): qty 1

## 2014-06-15 MED ORDER — FENTANYL CITRATE (PF) 100 MCG/2ML IJ SOLN
25.0000 ug | INTRAMUSCULAR | Status: DC | PRN
  Administered 2014-06-15 – 2014-06-19 (×21): 50 ug via INTRAVENOUS
  Filled 2014-06-15 (×21): qty 2

## 2014-06-15 MED ORDER — INSULIN ASPART 100 UNIT/ML ~~LOC~~ SOLN
0.0000 [IU] | SUBCUTANEOUS | Status: DC
  Administered 2014-06-15 (×2): 3 [IU] via SUBCUTANEOUS
  Administered 2014-06-15: 2 [IU] via SUBCUTANEOUS
  Administered 2014-06-16: 3 [IU] via SUBCUTANEOUS
  Administered 2014-06-16 (×3): 2 [IU] via SUBCUTANEOUS
  Administered 2014-06-16: 3 [IU] via SUBCUTANEOUS
  Administered 2014-06-16 – 2014-06-22 (×18): 2 [IU] via SUBCUTANEOUS
  Administered 2014-06-23: 3 [IU] via SUBCUTANEOUS
  Administered 2014-06-23 – 2014-06-24 (×3): 2 [IU] via SUBCUTANEOUS

## 2014-06-15 MED ORDER — PANTOPRAZOLE SODIUM 40 MG PO PACK
40.0000 mg | PACK | Freq: Every day | ORAL | Status: DC
  Administered 2014-06-16 – 2014-06-17 (×2): 40 mg
  Filled 2014-06-15 (×3): qty 20

## 2014-06-15 MED ORDER — SODIUM CHLORIDE 0.9 % IV BOLUS (SEPSIS)
500.0000 mL | Freq: Once | INTRAVENOUS | Status: AC
  Administered 2014-06-15: 500 mL via INTRAVENOUS

## 2014-06-15 MED ORDER — VANCOMYCIN HCL IN DEXTROSE 1-5 GM/200ML-% IV SOLN
1000.0000 mg | INTRAVENOUS | Status: DC
  Filled 2014-06-15: qty 200

## 2014-06-15 MED ORDER — MIDAZOLAM HCL 2 MG/2ML IJ SOLN
2.0000 mg | INTRAMUSCULAR | Status: DC | PRN
  Administered 2014-06-15 – 2014-06-19 (×23): 2 mg via INTRAVENOUS
  Filled 2014-06-15 (×23): qty 2

## 2014-06-15 MED ORDER — SODIUM CHLORIDE 3 % IV SOLN
INTRAVENOUS | Status: DC
  Administered 2014-06-15 – 2014-06-17 (×2): 20 mL/h via INTRAVENOUS
  Filled 2014-06-15 (×4): qty 500

## 2014-06-15 MED ORDER — NOREPINEPHRINE BITARTRATE 1 MG/ML IV SOLN
0.0000 ug/min | INTRAVENOUS | Status: DC
  Administered 2014-06-15: 15 ug/min via INTRAVENOUS
  Administered 2014-06-16: 8 ug/min via INTRAVENOUS
  Filled 2014-06-15 (×3): qty 16

## 2014-06-15 NOTE — Progress Notes (Signed)
Subjective: Patient remains intubated.  More responsive today.    Objective: Current vital signs: BP 106/67 mmHg  Pulse 77  Temp(Src) 98.2 F (36.8 C) (Oral)  Resp 11  Ht 5\' 3"  (1.6 m)  Wt 112.7 kg (248 lb 7.3 oz)  BMI 44.02 kg/m2  SpO2 95% Vital signs in last 24 hours: Temp:  [95.9 F (35.5 C)-99.9 F (37.7 C)] 98.2 F (36.8 C) (05/07 0827) Pulse Rate:  [75-109] 77 (05/07 0600) Resp:  [0-22] 11 (05/07 0600) BP: (80-115)/(56-87) 106/67 mmHg (05/07 0843) SpO2:  [93 %-100 %] 95 % (05/07 0600) Arterial Line BP: (78-108)/(51-73) 99/59 mmHg (05/07 0600) FiO2 (%):  [50 %-60 %] 50 % (05/07 0843) Weight:  [86.183 kg (190 lb)-112.7 kg (248 lb 7.3 oz)] 112.7 kg (248 lb 7.3 oz) (05/07 0500)  Intake/Output from previous day: 05/06 0701 - 05/07 0700 In: 5831.6 [I.V.:5147.8; NG/GT:363.8; IV Piggyback:300] Out: 35 [Urine:460] Intake/Output this shift:   Nutritional status:    Neurologic Exam: Mental Status: Patient does not respond to verbal stimuli.  Localizes to deep sternal rub with LUE only.  Does not move RUE.  Does not follow commands.  No attempts made at verbalization.  Cranial Nerves: II: patient does not respond confrontation bilaterally, pupils right 2 mm, left 2 mm,and reactive bilaterally III,IV,VI: doll's response absent bilaterally.  V,VII: corneal reflex present bilaterally  VIII: patient does not respond to verbal stimuli IX,X: gag reflex reduced, XI: trapezius strength unable to test bilaterally XII: tongue strength unable to test Motor: Moves LUE against gravity.  Minimal movement noted in lower extremities.  Only some finger movement noted in RUE. Sensory: With noxious stimuli to right upper extremity patient turns head to look and makes attempt to use LUE but does not move extremity   Lab Results: Basic Metabolic Panel:  Recent Labs Lab 07/04/2014 0728 06/13/2014 0747 07/09/2014 2034 06/15/14 0500  NA 115* 116* 119* 117*  K 5.0 4.5 3.7 4.1  CL 83* 82*  85* 86*  CO2 18*  --  21* 21*  GLUCOSE 128* 132* 151* 159*  BUN 13 17 15 20   CREATININE 0.84 0.80 1.14* 1.45*  CALCIUM 8.3*  --  7.9* 7.8*  MG  --   --   --  1.8  PHOS  --   --   --  5.6*    Liver Function Tests:  Recent Labs Lab 06/24/2014 0728  AST 73*  ALT 36  ALKPHOS 200*  BILITOT 3.1*  PROT 5.5*  ALBUMIN 2.8*   No results for input(s): LIPASE, AMYLASE in the last 168 hours. No results for input(s): AMMONIA in the last 168 hours.  CBC:  Recent Labs Lab 07/06/2014 0728 06/16/2014 0747 06/15/14 0500  WBC 10.3  --  12.4*  NEUTROABS 9.2*  --   --   HGB 20.5* 23.5* 19.3*  HCT 56.7* 69.0* 54.1*  MCV 82.9  --  82.5  PLT 116*  --  121*    Cardiac Enzymes:  Recent Labs Lab 07/01/2014 0728 06/28/2014 0830 06/23/2014 1400 07/07/2014 2034  CKTOTAL 844*  --   --   --   TROPONINI  --  0.08* 0.09* 0.08*    Lipid Panel: No results for input(s): CHOL, TRIG, HDL, CHOLHDL, VLDL, LDLCALC in the last 168 hours.  CBG:  Recent Labs Lab 06/23/2014 1131 06/11/2014 1646 06/11/2014 2005 06/13/2014 2352 06/15/14 0335  GLUCAP 112* 141* 125* 186* 180*    Microbiology: Results for orders placed or performed during the hospital encounter of 06/17/2014  MRSA PCR Screening     Status: Abnormal   Collection Time: 06/27/2014 10:46 AM  Result Value Ref Range Status   MRSA by PCR POSITIVE (A) NEGATIVE Final    Comment:        The GeneXpert MRSA Assay (FDA approved for NASAL specimens only), is one component of a comprehensive MRSA colonization surveillance program. It is not intended to diagnose MRSA infection nor to guide or monitor treatment for MRSA infections. RESULT CALLED TO, READ BACK BY AND VERIFIED WITH: J.BEABRAUT RN [id number] @ 1448 VESTALM   Culture, respiratory (NON-Expectorated)     Status: None (Preliminary result)   Collection Time: 06/18/2014 11:25 AM  Result Value Ref Range Status   Specimen Description TRACHEAL ASPIRATE  Final   Special Requests NONE  Final   Gram Stain    Final    RARE WBC PRESENT, PREDOMINANTLY MONONUCLEAR RARE SQUAMOUS EPITHELIAL CELLS PRESENT NO ORGANISMS SEEN Performed at [facility]    Culture   Final    Non-Pathogenic Oropharyngeal-type Flora Isolated. Performed at [facility]    Report Status PENDING  Incomplete    Coagulation Studies:  Recent Labs  06/20/2014 0728  LABPROT 17.5*  INR 1.42    Imaging: Ct Head Wo Contrast  06/19/2014   CLINICAL DATA:  64 year old female with a history of altered mental status  EXAM: CT HEAD WITHOUT CONTRAST  TECHNIQUE: Contiguous axial images were obtained from the base of the skull through the vertex without intravenous contrast.  COMPARISON:  04/14/2013, 06/13/2010  FINDINGS: Unremarkable appearance of the calvarium without acute fracture or aggressive lesion.  Unremarkable appearance of the scalp soft tissues.  Unremarkable appearance of the bilateral orbits.  Endotracheal tube in place. Secretions in the oropharynx and nasopharynx.  Bilateral lens extraction.  Mastoid air cells are clear.  No significant paranasal sinus disease  No acute intracranial hemorrhage. No midline shift or mass effect. Hyperdense appearance of the anterior cerebral vasculature was present on the comparison CT, potentially reflecting atherosclerotic changes.  New confluent hypodensity in the left high parietal region extending from the white matter into the cortex.  Gray-white differentiation maintained.  IMPRESSION: No CT evidence of acute intracranial abnormality.  New hypodensity in the left parietal region extending through the cortex, not present on the comparison CT dated 04/14/2013. This is favored to represent an interval infarction, age indeterminate.  Intracranial atherosclerosis.  Signed,  Dulcy Fanny. Earleen Newport, DO  [facility]  [facility]   Electronically Signed   By: Corrie Mckusick D.O.   On: 06/09/2014 08:41   US Abdomen Complete  06/25/2014   CLINICAL  DATA:  Polycythemia  EXAM: ULTRASOUND ABDOMEN COMPLETE  COMPARISON:  None.  FINDINGS: Gallbladder: Surgically absent  Common bile duct: Diameter: 3.4 mm.  Not comprehensively seen.  Liver: Suboptimally visualized. Liver has a coarsened echotexture. Questionable surface nodularity. No mass or focal lesion.  IVC: No abnormality visualized.  Pancreas: Not visualized  Spleen: Size and appearance within normal limits.  Right Kidney: Length: 13.3 cm. Not well visualized. Grossly normal in parenchymal echogenicity. No mass or hydronephrosis.  Left Kidney: Length: 13.8 cm. Not well visualized. Grossly normal in parenchymal echogenicity. No mass or hydronephrosis.  Abdominal aorta: Not visualized.  Other findings: Small amount ascites.  IMPRESSION: 1. Significant bowel gas obscures midline structures. 2. Coarsened liver echotexture and questionable surface nodularity. Consider cirrhosis in the proper clinical setting. 3. Spleen normal in size. 4. Status post cholecystectomy. 5. Small amount of ascites.  Electronically Signed   By: Lajean Manes M.D.   On: 06/25/2014 17:45   Dg Chest Port 1 View  07/07/2014   CLINICAL DATA:  Encounter for central line placement  EXAM: PORTABLE CHEST - 1 VIEW  COMPARISON:  06/13/2014 at 7:32 a.m.  FINDINGS: New right IJ central line, tip at the lower SVC level. No pneumothorax or new mediastinal widening. The endotracheal tube remains in good position with tip at the clavicular heads. Orogastric tube enters the stomach at least.  Marked cardiopericardial enlargement with vascular pedicle widening and pulmonary venous congestion and edema. Bilateral layering effusions.  IMPRESSION: 1. New right IJ catheter with tip in good position. No pneumothorax. 2. CHF.   Electronically Signed   By: Monte Fantasia M.D.   On: 06/22/2014 09:28   Dg Chest Portable 1 View  07/09/2014   CLINICAL DATA:  64 year old female with a history of altered mental status  EXAM: PORTABLE CHEST - 1 VIEW  COMPARISON:   02/02/2013  FINDINGS: Cardiomediastinal silhouette likely unchanged. Significant right rotation the patient limits evaluation.  Fullness in the hilar regions.  Interlobular septal thickening with interstitial opacities.  Dense opacity at the right base obscures the right hemidiaphragm in the right heart border. Blunting of the right costophrenic angle.  Endotracheal tube terminates above the carina approximately 4 cm.  Gastric tube projects over the mediastinum.  IMPRESSION: Evidence of CHF, and likely right pleural effusion.  Endotracheal tube terminates suitably above the carina.  Gastric tube in place.  Signed,  Dulcy Fanny. Earleen Newport, DO  [facility]  [facility]iology   Electronically Signed   By: Corrie Mckusick D.O.   On: 07/06/2014 07:40    Medications:  I have reviewed the patient's current medications. Scheduled: . antiseptic oral rinse  7 mL Mouth Rinse QID  . aspirin  81 mg Oral Daily  . chlorhexidine  15 mL Mouth Rinse BID  . Chlorhexidine Gluconate Cloth  6 each Topical Q0600  . EPINEPHrine  0.5 mg Intravenous Once  . feeding supplement (VITAL HIGH PROTEIN)  1,000 mL Per Tube Q24H  . hydrocerin   Topical BID  . hydrocortisone sodium succinate  50 mg Intravenous Q6H  . mupirocin cream   Topical BID  . mupirocin ointment  1 application Nasal BID  . pantoprazole (PROTONIX) IV  40 mg Intravenous Q24H  . piperacillin-tazobactam (ZOSYN)  IV  3.375 g Intravenous 3 times per day  . vancomycin  1,000 mg Intravenous Q12H    Assessment/Plan: Continued AMS.  Multiple metabolic issues continue such as renal insufficiency and low sodium which are likely contributing.  Antipsychotics have been discontinued.  White blood cell count increasing.  Concern for change in neurological examination with no movement noted of the RUE.  Hypodensity noted on CT at admission.  Patient on ASA.  EEG only significant for slowing.  MRI pending.    Recommendations: 1.  Will  follow up results of MRI 2.  Continue ASA 3.  Agree with continued correction of metabolic issues.      LOS: 1 day   Alexis Goodell, MD [facility] [phone] 06/15/2014  8:45 AM

## 2014-06-15 NOTE — Progress Notes (Signed)
[facility] Progress Note Patient Name: Ana Hampton DOB: [birth year] MRN: [id number]   Date of Service  06/15/2014  HPI/Events of Note  Na+ = 118 ---> 114. Legs cyanotic without pulses according to bedside nurse and have been this way since admission 2 days ago. Patient is on vasopressors.   eICU Interventions  Will order: 1. 3% NaCl to run at 20 mL/hour.  2. Decrease 0.9 NaCl IV infusion to 130 mL/hour. 3. Check Lactic acid level now.      Intervention Category Major Interventions: Electrolyte abnormality - evaluation and management  Sommer,Steven [name] 06/15/2014, 8:59 PM

## 2014-06-15 NOTE — Progress Notes (Signed)
PULMONARY / CRITICAL CARE MEDICINE   Name: Ana Hampton MRN: [id number] DOB: 08-23-1950    ADMISSION DATE:  06/21/2014  REFERRING MD :  [name]  CHIEF COMPLAINT:  Unresponsive  INITIAL PRESENTATION: 64 year old with extensive psych history but otherwise not much is known who was acting different the day prior to presentation, family did not think much of it.  They went to sleep woke up this AM and found her unresponsive on the floor.  EMS was called and patient was brought to the ED.  She was not protecting her airway and [name] intubated patient.  PCCM was called to admit.  Little other history is available and no family bedside.  Patient was hypothermic on presentation, now febrile.  STUDIES:  5/06 echo EF 30 to 57%, grade 1 diastolic dysfx, PAS 43 mmHg  SIGNIFICANT EVENTS: 5/6 unresponsive, to ED and intubated, neuro consulted  SUBJECTIVE:  Remains on pressors.  VITAL SIGNS: Temp:  [95.9 F (35.5 C)-98.6 F (37 C)] 98.2 F (36.8 C) (05/07 0827) Pulse Rate:  [74-99] 74 (05/07 1000) Resp:  [0-22] 18 (05/07 1000) BP: (81-115)/(56-98) 97/68 mmHg (05/07 1000) SpO2:  [93 %-100 %] 96 % (05/07 1000) Arterial Line BP: (78-108)/(51-73) 103/60 mmHg (05/07 1000) FiO2 (%):  [50 %] 50 % (05/07 1000) Weight:  [248 lb 7.3 oz (112.7 kg)] 248 lb 7.3 oz (112.7 kg) (05/07 0500) HEMODYNAMICS: CVP:  [5 mmHg-41 mmHg] 16 mmHg VENTILATOR SETTINGS: Vent Mode:  [-] PRVC FiO2 (%):  [50 %] 50 % Set Rate:  [18 bmp] 18 bmp Vt Set:  [470 mL] 470 mL PEEP:  [5 cmH20] 5 cmH20 Plateau Pressure:  [17 cmH20-24 cmH20] 24 cmH20 INTAKE / OUTPUT:  Intake/Output Summary (Last 24 hours) at 06/15/14 1104 Last data filed at 06/15/14 1000  Gross per 24 hour  Intake 5356.75 ml  Output    490 ml  Net 4866.75 ml    PHYSICAL EXAMINATION: General:  Chronically ill appearing female, completely unresponsive. Neuro:  Unresponsive, does not withdraw to pain, has a respiratory drive, pupils are non-reactive and no corneal  reflexes, scleral edema. HEENT:  /AT, PERRL, EOM-I and MMM. Cardiovascular:  RRR, Nl S1/S2, -M/R/G (post cardioversion). Lungs:  Diffuse crackles in all lung fields. Abdomen:  Soft, NT, ND and +BS. Musculoskeletal:  Severe erythema diffusely, bilateral lower ext edema and venous stasis but pulses are weak and present. Skin:  Diffuse erythema, groin with erythema as well.  LABS:  CBC  Recent Labs Lab 07/07/2014 0728 06/21/2014 0747 06/15/14 0500  WBC 10.3  --  12.4*  HGB 20.5* 23.5* 19.3*  HCT 56.7* 69.0* 54.1*  PLT 116*  --  121*   Coag's  Recent Labs Lab 07/04/2014 0728  INR 1.42   BMET  Recent Labs Lab 07/06/2014 0728 06/24/2014 0747 06/25/2014 2034 06/15/14 0500  NA 115* 116* 119* 117*  K 5.0 4.5 3.7 4.1  CL 83* 82* 85* 86*  CO2 18*  --  21* 21*  BUN 13 17 15 20   CREATININE 0.84 0.80 1.14* 1.45*  GLUCOSE 128* 132* 151* 159*   Electrolytes  Recent Labs Lab 06/19/2014 0728 06/19/2014 2034 06/15/14 0500  CALCIUM 8.3* 7.9* 7.8*  MG  --   --  1.8  PHOS  --   --  5.6*   Sepsis Markers  Recent Labs Lab 06/13/2014 0747 07/01/2014 0830 06/15/14 0500  LATICACIDVEN 3.51*  --   --   PROCALCITON  --  1.06 1.93   ABG  Recent Labs  Lab 07/06/2014 1008 06/15/14 0255  PHART 7.219* 7.299*  PCO2ART 55.8* 47.6*  PO2ART 131.0* 93.4   Liver Enzymes  Recent Labs Lab 06/22/2014 0728  AST 73*  ALT 36  ALKPHOS 200*  BILITOT 3.1*  ALBUMIN 2.8*   Cardiac Enzymes  Recent Labs Lab 06/12/2014 0830 06/13/2014 1400 06/17/2014 2034  TROPONINI 0.08* 0.09* 0.08*   Glucose  Recent Labs Lab 07/06/2014 1131 07/01/2014 1646 06/10/2014 2005 06/27/2014 2352 06/15/14 0335 06/15/14 0830  GLUCAP 112* 141* 125* 186* 180* 184*    Imaging Ct Head Wo Contrast  06/28/2014   CLINICAL DATA:  64 year old female with a history of altered mental status  EXAM: CT HEAD WITHOUT CONTRAST  TECHNIQUE: Contiguous axial images were obtained from the base of the skull through the vertex without  intravenous contrast.  COMPARISON:  04/14/2013, 06/13/2010  FINDINGS: Unremarkable appearance of the calvarium without acute fracture or aggressive lesion.  Unremarkable appearance of the scalp soft tissues.  Unremarkable appearance of the bilateral orbits.  Endotracheal tube in place. Secretions in the oropharynx and nasopharynx.  Bilateral lens extraction.  Mastoid air cells are clear.  No significant paranasal sinus disease  No acute intracranial hemorrhage. No midline shift or mass effect. Hyperdense appearance of the anterior cerebral vasculature was present on the comparison CT, potentially reflecting atherosclerotic changes.  New confluent hypodensity in the left high parietal region extending from the white matter into the cortex.  Gray-white differentiation maintained.  IMPRESSION: No CT evidence of acute intracranial abnormality.  New hypodensity in the left parietal region extending through the cortex, not present on the comparison CT dated 04/14/2013. This is favored to represent an interval infarction, age indeterminate.  Intracranial atherosclerosis.  Signed,  Dulcy Fanny. Earleen Newport, DO  [facility]  [facility] Radiology   Electronically Signed   By: Corrie Mckusick D.O.   On: 07/09/2014 08:41   US Abdomen Complete  07/04/2014   CLINICAL DATA:  Polycythemia  EXAM: ULTRASOUND ABDOMEN COMPLETE  COMPARISON:  None.  FINDINGS: Gallbladder: Surgically absent  Common bile duct: Diameter: 3.4 mm.  Not comprehensively seen.  Liver: Suboptimally visualized. Liver has a coarsened echotexture. Questionable surface nodularity. No mass or focal lesion.  IVC: No abnormality visualized.  Pancreas: Not visualized  Spleen: Size and appearance within normal limits.  Right Kidney: Length: 13.3 cm. Not well visualized. Grossly normal in parenchymal echogenicity. No mass or hydronephrosis.  Left Kidney: Length: 13.8 cm. Not well visualized. Grossly normal in parenchymal echogenicity. No mass  or hydronephrosis.  Abdominal aorta: Not visualized.  Other findings: Small amount ascites.  IMPRESSION: 1. Significant bowel gas obscures midline structures. 2. Coarsened liver echotexture and questionable surface nodularity. Consider cirrhosis in the proper clinical setting. 3. Spleen normal in size. 4. Status post cholecystectomy. 5. Small amount of ascites.   Electronically Signed   By: Lajean Manes M.D.   On: 06/18/2014 17:45   Dg Chest Port 1 View  06/10/2014   CLINICAL DATA:  Encounter for central line placement  EXAM: PORTABLE CHEST - 1 VIEW  COMPARISON:  06/27/2014 at 7:32 a.m.  FINDINGS: New right IJ central line, tip at the lower SVC level. No pneumothorax or new mediastinal widening. The endotracheal tube remains in good position with tip at the clavicular heads. Orogastric tube enters the stomach at least.  Marked cardiopericardial enlargement with vascular pedicle widening and pulmonary venous congestion and edema. Bilateral layering effusions.  IMPRESSION: 1. New right IJ catheter with tip in good position. No pneumothorax. 2.  CHF.   Electronically Signed   By: Monte Fantasia M.D.   On: 06/25/2014 09:28   Dg Chest Portable 1 View  06/30/2014   CLINICAL DATA:  64 year old female with a history of altered mental status  EXAM: PORTABLE CHEST - 1 VIEW  COMPARISON:  02/02/2013  FINDINGS: Cardiomediastinal silhouette likely unchanged. Significant right rotation the patient limits evaluation.  Fullness in the hilar regions.  Interlobular septal thickening with interstitial opacities.  Dense opacity at the right base obscures the right hemidiaphragm in the right heart border. Blunting of the right costophrenic angle.  Endotracheal tube terminates above the carina approximately 4 cm.  Gastric tube projects over the mediastinum.  IMPRESSION: Evidence of CHF, and likely right pleural effusion.  Endotracheal tube terminates suitably above the carina.  Gastric tube in place.  Signed,  Dulcy Fanny. Earleen Newport, DO   [facility]  [facility]   Electronically Signed   By: Corrie Mckusick D.O.   On: 06/24/2014 07:40     ASSESSMENT / PLAN:  PULMONARY OETT 5/6>>> A:  VDRF due to inability to protect her airway, cause of AMS unknown at this time. P:   Full vent support F/u CXR  CARDIOVASCULAR CVL L IJ TLC 5/6>>> A:  Shock ?cardiogenic versus septic. Acute systolic heart failure. A-fib with associated hypotension that [name] cardioverted. P:  Might need cardiology consult Pressors to keep MAP > 65  RENAL A:   Hyponatremia. P:   Check serum osmolarity Continue NS IV fluid  GASTROINTESTINAL A:   Nutrition. P:   Tube feeds  HEMATOLOGIC A:   Polycythemia ? From chronic hypoxia. P:  F/u CBC SQ heparin Might need hematology assessment if it does not improve further  INFECTIOUS A:   Pneumonia. P:   Day 2 vancomycin, zosyn  BCx2 5/6>>> UC 5/6>>> Sputum 5/6>>>   ENDOCRINE A:   Hyperglycemia. Relative adrenal insufficiency. P:   SSI Solu cortef Check TSH  NEUROLOGIC A:   Acute encephalopathy with concern for sub acute CVA. Hx of bipolar. P:   For MRI 5/07  CC time 35 minutes  Chesley Mires, MD [place] 06/15/2014, 11:13 AM Pager:  [phone] After 3pm call: [phone]

## 2014-06-15 NOTE — Progress Notes (Signed)
CRITICAL VALUE ALERT  Critical value received:  Na 118   Date of notification:  06/15/14  Time of notification:  7185  Critical value read back:Yes.    Nurse who received alert:  Trixie Rude RN (Relayed message to me, Sanjuana Kava, RN)  MD notified (1st page):  MD Paged [phone], Warren Lacy notified and Sharee Pimple, RN will relay message to MD at 1500  Time of first page:  1457   Responding MD:  Dr. Madalyn Rob   Time MD responded:  [phone]

## 2014-06-15 NOTE — Progress Notes (Addendum)
ANTIBIOTIC CONSULT NOTE - Follow-up  Pharmacy Consult for vancomycin and Zosyn Indication:  PNA  Allergies  Allergen Reactions  . Abilify [Aripiprazole] Shortness Of Breath  . Geodon [Ziprasidone Hcl]   . Niaspan [Niacin Er]   . Prednisone     Patient Measurements: Ht: 63 in Wt: 113 kg  Vital Signs: Temp: 98.2 F (36.8 C) (05/07 0827) Temp Source: Oral (05/07 1200) BP: 97/70 mmHg (05/07 1100) Pulse Rate: 75 (05/07 1100) Intake/Output from previous day: 05/06 0701 - 05/07 0700 In: 6042.9 [I.V.:5309.1; NG/GT:413.8; IV Piggyback:300] Out: 460 [Urine:460] Intake/Output from this shift: Total I/O In: 1256.5 [I.V.:806.5; NG/GT:250; IV Piggyback:200] Out: 50 [Urine:50]  Labs:  Recent Labs  06/28/2014 0728 06/17/2014 0747 06/15/2014 1425 06/25/2014 2034 06/15/14 0500  WBC 10.3  --   --   --  12.4*  HGB 20.5* 23.5*  --   --  19.3*  PLT 116*  --   --   --  121*  LABCREA  --   --  103.16  --   --   CREATININE 0.84 0.80  --  1.14* 1.45*   Estimated Creatinine Clearance: 49.2 mL/min (by C-G formula based on Cr of 1.45). No results for input(s): VANCOTROUGH, VANCOPEAK, VANCORANDOM, GENTTROUGH, GENTPEAK, GENTRANDOM, TOBRATROUGH, TOBRAPEAK, TOBRARND, AMIKACINPEAK, AMIKACINTROU, AMIKACIN in the last 72 hours.   Microbiology: Recent Results (from the past 720 hour(s))  Culture, blood (routine x 2)     Status: None (Preliminary result)   Collection Time: 07/05/2014  7:20 AM  Result Value Ref Range Status   Specimen Description BLOOD LEFT HAND  Final   Special Requests BOTTLES DRAWN AEROBIC AND ANAEROBIC 5CCS  Final   Culture   Final           BLOOD CULTURE RECEIVED NO GROWTH TO DATE CULTURE WILL BE HELD FOR 5 DAYS BEFORE ISSUING A FINAL NEGATIVE REPORT Performed at [facility]    Report Status PENDING  Incomplete  Culture, blood (routine x 2)     Status: None (Preliminary result)   Collection Time: 06/16/2014  7:28 AM  Result Value Ref Range Status   Specimen Description  BLOOD RIGHT HAND  Final   Special Requests BOTTLES DRAWN AEROBIC ONLY 2CCS  Final   Culture   Final           BLOOD CULTURE RECEIVED NO GROWTH TO DATE CULTURE WILL BE HELD FOR 5 DAYS BEFORE ISSUING A FINAL NEGATIVE REPORT Performed at [facility]    Report Status PENDING  Incomplete  MRSA PCR Screening     Status: Abnormal   Collection Time: 06/20/2014 10:46 AM  Result Value Ref Range Status   MRSA by PCR POSITIVE (A) NEGATIVE Final    Comment:        The GeneXpert MRSA Assay (FDA approved for NASAL specimens only), is one component of a comprehensive MRSA colonization surveillance program. It is not intended to diagnose MRSA infection nor to guide or monitor treatment for MRSA infections. RESULT CALLED TO, READ BACK BY AND VERIFIED WITH: J.BEABRAUT RN [id number] @ 4650 VESTALM   Culture, respiratory (NON-Expectorated)     Status: None (Preliminary result)   Collection Time: 07/04/2014 11:25 AM  Result Value Ref Range Status   Specimen Description TRACHEAL ASPIRATE  Final   Special Requests NONE  Final   Gram Stain   Final    RARE WBC PRESENT, PREDOMINANTLY MONONUCLEAR RARE SQUAMOUS EPITHELIAL CELLS PRESENT NO ORGANISMS SEEN Performed at [facility]  Final    Non-Pathogenic Oropharyngeal-type Flora Isolated. Performed at [facility]    Report Status PENDING  Incomplete    Assessment: 64 y/o female continues on Vancomycin and Zosyn (Day #2) for PNA. Tmax 100.6, WBC trending up to 12.4. Lactic acid 3.51. PCT up to 1.9.  Vanc 5/6>>  Zosyn 5/6>>   5/6 BCx2 >ngtd  5/6 UCx >  5/6 Trach asp>ngtd  MRSA PCR positive  Nephrology: SCr trending up to 1.45, est normalized CrCl 45 ml/min. UOP down to 460/24h.  Goal of Therapy:  Vancomycin trough level 15-20 mcg/ml Eradication of infection  Plan:  - Vancomycin 1000 mg IV q12h - check vanc trough tonight - expect to be high with acute SCr elevation, decreased UOP - Zosyn 3.375 g IV q8h to  be infused over 4 hours - Monitor renal function, clinical progress, and culture data  Sherlon Handing, PharmD, BCPS Clinical pharmacist, pager [phone] 06/15/2014 12:40 PM  Addendum 1945 Vancomycin trough 30 mcg/ml (supratherapeutic) on 1gm IV q12h. Noted that SCr is continuing the upward trend to 1.74, est normalized CrCl 40 ml/min.  Plan: Change Vancomycin to 1000mg  IV q24h. Next dose due 5/8 2100. Will f/u renal function - may require another level tomorrow afternoon if continues to worsen.  Sherlon Handing, PharmD, BCPS Clinical pharmacist, pager [phone] 06/15/2014 7:49 PM

## 2014-06-15 NOTE — Progress Notes (Signed)
[facility] Progress Note Patient Name: Ana Hampton DOB: 1950-06-09 MRN: [id number]   Date of Service  06/15/2014  HPI/Events of Note  Relative hypotension on pressors with increasing pressor requirements.  CVP of 3.  Current BP of 93/54 (68).  Oliguria.  eICU Interventions  Plan: 500 cc NS bolus for BP support     Intervention Category Intermediate Interventions: Hypotension - evaluation and management  Ana Hampton 06/15/2014, 3:30 AM

## 2014-06-15 NOTE — Progress Notes (Signed)
1705 Positive blood culture reported. Gram positive rods in aerobic bottle. CCM notified.

## 2014-06-15 NOTE — Progress Notes (Signed)
[name] Progress Note Patient Name: Ana Hampton DOB: 15-Jun-1950 MRN: [id number]   Date of Service  06/15/2014  HPI/Events of Note  Patient nearly self extubated X 2.   eICU Interventions  Will add Fentanyl 25 - 50 mcg IV Q 2 hours PRN.     Intervention Category Minor Interventions: Agitation / anxiety - evaluation and management  Hanne Kegg [name] 06/15/2014, 10:08 PM

## 2014-06-16 ENCOUNTER — Inpatient Hospital Stay (HOSPITAL_COMMUNITY): Payer: Medicare Other

## 2014-06-16 DIAGNOSIS — D751 Secondary polycythemia: Secondary | ICD-10-CM

## 2014-06-16 DIAGNOSIS — G9341 Metabolic encephalopathy: Secondary | ICD-10-CM

## 2014-06-16 LAB — BLOOD GAS, ARTERIAL
ACID-BASE DEFICIT: 3.8 mmol/L — AB (ref 0.0–2.0)
Bicarbonate: 21.6 mEq/L (ref 20.0–24.0)
Drawn by: 398661
FIO2: 0.5 %
MECHVT: 470 mL
O2 Saturation: 97.2 %
PEEP: 5 cmH2O
PO2 ART: 97.1 mmHg (ref 80.0–100.0)
Patient temperature: 97.5
RATE: 18 resp/min
TCO2: 23.1 mmol/L (ref 0–100)
pCO2 arterial: 44.7 mmHg (ref 35.0–45.0)
pH, Arterial: 7.302 — ABNORMAL LOW (ref 7.350–7.450)

## 2014-06-16 LAB — CULTURE, RESPIRATORY

## 2014-06-16 LAB — GLUCOSE, CAPILLARY
GLUCOSE-CAPILLARY: 138 mg/dL — AB (ref 70–99)
GLUCOSE-CAPILLARY: 163 mg/dL — AB (ref 70–99)
Glucose-Capillary: 128 mg/dL — ABNORMAL HIGH (ref 70–99)
Glucose-Capillary: 136 mg/dL — ABNORMAL HIGH (ref 70–99)
Glucose-Capillary: 145 mg/dL — ABNORMAL HIGH (ref 70–99)
Glucose-Capillary: 154 mg/dL — ABNORMAL HIGH (ref 70–99)

## 2014-06-16 LAB — HEPATIC FUNCTION PANEL
ALT: 31 U/L (ref 14–54)
AST: 57 U/L — ABNORMAL HIGH (ref 15–41)
Albumin: 2 g/dL — ABNORMAL LOW (ref 3.5–5.0)
Alkaline Phosphatase: 109 U/L (ref 38–126)
Bilirubin, Direct: 1.3 mg/dL — ABNORMAL HIGH (ref 0.1–0.5)
Indirect Bilirubin: 0.9 mg/dL (ref 0.3–0.9)
TOTAL PROTEIN: 4.3 g/dL — AB (ref 6.5–8.1)
Total Bilirubin: 2.2 mg/dL — ABNORMAL HIGH (ref 0.3–1.2)

## 2014-06-16 LAB — CBC
HCT: 51.7 % — ABNORMAL HIGH (ref 36.0–46.0)
HEMOGLOBIN: 18.2 g/dL — AB (ref 12.0–15.0)
MCH: 29.1 pg (ref 26.0–34.0)
MCHC: 35.2 g/dL (ref 30.0–36.0)
MCV: 82.6 fL (ref 78.0–100.0)
Platelets: 125 10*3/uL — ABNORMAL LOW (ref 150–400)
RBC: 6.26 MIL/uL — AB (ref 3.87–5.11)
RDW: 18.2 % — ABNORMAL HIGH (ref 11.5–15.5)
WBC: 11.8 10*3/uL — ABNORMAL HIGH (ref 4.0–10.5)

## 2014-06-16 LAB — BASIC METABOLIC PANEL
ANION GAP: 11 (ref 5–15)
Anion gap: 11 (ref 5–15)
BUN: 30 mg/dL — AB (ref 6–20)
BUN: 32 mg/dL — AB (ref 6–20)
CALCIUM: 7.6 mg/dL — AB (ref 8.9–10.3)
CO2: 21 mmol/L — ABNORMAL LOW (ref 22–32)
CO2: 19 mmol/L — ABNORMAL LOW (ref 22–32)
CREATININE: 1.9 mg/dL — AB (ref 0.44–1.00)
Calcium: 8 mg/dL — ABNORMAL LOW (ref 8.9–10.3)
Chloride: 88 mmol/L — ABNORMAL LOW (ref 101–111)
Chloride: 89 mmol/L — ABNORMAL LOW (ref 101–111)
Creatinine, Ser: 1.98 mg/dL — ABNORMAL HIGH (ref 0.44–1.00)
GFR calc Af Amer: 32 mL/min — ABNORMAL LOW (ref >60)
GFR calc Af Amer: 30 mL/min — ABNORMAL LOW (ref >60)
GFR calc non Af Amer: 26 mL/min — ABNORMAL LOW (ref >60)
GFR, EST NON AFRICAN AMERICAN: 27 mL/min — AB (ref >60)
GLUCOSE: 160 mg/dL — AB (ref 70–99)
Glucose, Bld: 138 mg/dL — ABNORMAL HIGH (ref 70–99)
Potassium: 3.9 mmol/L (ref 3.5–5.1)
Potassium: 4.1 mmol/L (ref 3.5–5.1)
Sodium: 120 mmol/L — ABNORMAL LOW (ref 135–145)
Sodium: 119 mmol/L — CL (ref 135–145)

## 2014-06-16 LAB — HEPATITIS PANEL, ACUTE
HCV Ab: NEGATIVE
Hep A IgM: NONREACTIVE
Hep B C IgM: NONREACTIVE
Hepatitis B Surface Ag: NEGATIVE

## 2014-06-16 LAB — SODIUM
SODIUM: 121 mmol/L — AB (ref 135–145)
Sodium: 121 mmol/L — ABNORMAL LOW (ref 135–145)
Sodium: 120 mmol/L — ABNORMAL LOW (ref 135–145)

## 2014-06-16 LAB — CULTURE, RESPIRATORY W GRAM STAIN

## 2014-06-16 LAB — MAGNESIUM: Magnesium: 1.9 mg/dL (ref 1.7–2.4)

## 2014-06-16 LAB — PROCALCITONIN: Procalcitonin: 1.94 ng/mL

## 2014-06-16 LAB — VANCOMYCIN, RANDOM: Vancomycin Rm: 25 ug/mL

## 2014-06-16 LAB — OSMOLALITY: OSMOLALITY: 262 mosm/kg — AB (ref 275–300)

## 2014-06-16 LAB — PHOSPHORUS: Phosphorus: 5.4 mg/dL — ABNORMAL HIGH (ref 2.5–4.6)

## 2014-06-16 MED ORDER — LEVOTHYROXINE SODIUM 25 MCG PO TABS
25.0000 ug | ORAL_TABLET | Freq: Every day | ORAL | Status: DC
  Administered 2014-06-17: 25 ug via ORAL
  Filled 2014-06-16 (×2): qty 1

## 2014-06-16 MED ORDER — FUROSEMIDE 10 MG/ML IJ SOLN
40.0000 mg | Freq: Once | INTRAMUSCULAR | Status: AC
  Administered 2014-06-16: 40 mg via INTRAVENOUS
  Filled 2014-06-16: qty 4

## 2014-06-16 NOTE — Progress Notes (Signed)
Subjective: Patient intubated with prn Fentanyl and Versed.    Objective: Current vital signs: BP 83/57 mmHg  Pulse 75  Temp(Src) 98.4 F (36.9 C) (Oral)  Resp 19  Ht 5\' 3"  (1.6 m)  Wt 114.3 kg (251 lb 15.8 oz)  BMI 44.65 kg/m2  SpO2 95% Vital signs in last 24 hours: Temp:  [97.5 F (36.4 C)-98.5 F (36.9 C)] 98.4 F (36.9 C) (05/08 0842) Pulse Rate:  [65-79] 75 (05/08 1000) Resp:  [14-20] 19 (05/08 1000) BP: (83-119)/(57-98) 83/57 mmHg (05/08 1000) SpO2:  [92 %-99 %] 95 % (05/08 1000) Arterial Line BP: (89-107)/(53-66) 103/63 mmHg (05/08 1000) FiO2 (%):  [40 %-50 %] 40 % (05/08 0900) Weight:  [114.3 kg (251 lb 15.8 oz)] 114.3 kg (251 lb 15.8 oz) (05/08 0352)  Intake/Output from previous day: 05/07 0701 - 05/08 0700 In: 4782 [I.V.:3182; NG/GT:1275; IV Piggyback:325] Out: 545 [Urine:545] Intake/Output this shift: Total I/O In: 297.5 [I.V.:62.5; NG/GT:210; IV Piggyback:25] Out: 45 [Urine:45] Nutritional status:    Neurologic Exam: Mental Status: Patient does not respond to verbal stimuli. Localizes to deep sternal rub with both upper extremities.  Does not follow commands. No attempts made at verbalization.  Cranial Nerves: II: patient does not respond confrontation bilaterally, pupils right 2 mm, left 2 mm,and reactive bilaterally III,IV,VI: doll's response present bilaterally.  V,VII: corneal reflex present bilaterally  VIII: patient does not respond to verbal stimuli IX,X: gag reflex reduced, XI: trapezius strength unable to test bilaterally XII: tongue strength unable to test Motor: Moves both upper extremities against gravity. Minimal movement noted in lower extremities.  Sensory: With noxious stimuli to right upper extremity patient turns head to look and makes attempt to use LUE but does not move extremity   Lab Results: Basic Metabolic Panel:  Recent Labs Lab 06/15/14 0500 06/15/14 1352 06/15/14 1910 06/16/14 0102 06/16/14 0316 06/16/14 0805   NA 117* 118* 114* 120* 119* 121*  K 4.1 4.4 3.8 3.9 4.1  --   CL 86* 88* 89* 88* 89*  --   CO2 21* 22 19* 21* 19*  --   GLUCOSE 159* 132* 165* 160* 138*  --   BUN 20 24* 27* 30* 32*  --   CREATININE 1.45* 1.67* 1.74* 1.90* 1.98*  --   CALCIUM 7.8* 7.9* 7.6* 7.6* 8.0*  --   MG 1.8  --   --   --  1.9  --   PHOS 5.6*  --   --   --  5.4*  --     Liver Function Tests:  Recent Labs Lab 07/04/2014 0728 06/16/14 0316  AST 73* 57*  ALT 36 31  ALKPHOS 200* 109  BILITOT 3.1* 2.2*  PROT 5.5* 4.3*  ALBUMIN 2.8* 2.0*   No results for input(s): LIPASE, AMYLASE in the last 168 hours. No results for input(s): AMMONIA in the last 168 hours.  CBC:  Recent Labs Lab 06/10/2014 0728 06/21/2014 0747 06/15/14 0500 06/16/14 0316  WBC 10.3  --  12.4* 11.8*  NEUTROABS 9.2*  --   --   --   HGB 20.5* 23.5* 19.3* 18.2*  HCT 56.7* 69.0* 54.1* 51.7*  MCV 82.9  --  82.5 82.6  PLT 116*  --  121* 125*    Cardiac Enzymes:  Recent Labs Lab 06/15/2014 0728 06/13/2014 0830 07/02/2014 1400 06/22/2014 2034  CKTOTAL 844*  --   --   --   TROPONINI  --  0.08* 0.09* 0.08*    Lipid Panel: No results for input(s):  CHOL, TRIG, HDL, CHOLHDL, VLDL, LDLCALC in the last 168 hours.  CBG:  Recent Labs Lab 06/15/14 1635 06/15/14 1640 06/15/14 1928 06/15/14 2350 06/16/14 0406  GLUCAP 163* 165* 157* 154* 128*    Microbiology: Results for orders placed or performed during the hospital encounter of 06/15/2014  Culture, blood (routine x 2)     Status: None (Preliminary result)   Collection Time: 06/24/2014  7:20 AM  Result Value Ref Range Status   Specimen Description BLOOD LEFT HAND  Final   Special Requests BOTTLES DRAWN AEROBIC AND ANAEROBIC 5CCS  Final   Culture   Final           BLOOD CULTURE RECEIVED NO GROWTH TO DATE CULTURE WILL BE HELD FOR 5 DAYS BEFORE ISSUING A FINAL NEGATIVE REPORT Performed at [facility]    Report Status PENDING  Incomplete  Culture, blood (routine x 2)     Status: None  (Preliminary result)   Collection Time: 06/27/2014  7:28 AM  Result Value Ref Range Status   Specimen Description BLOOD RIGHT HAND  Final   Special Requests BOTTLES DRAWN AEROBIC ONLY 2CCS  Final   Culture   Final    GRAM POSITIVE COCCI IN CLUSTERS Note: Gram Stain Report Called to,Read Back By and Verified With: JOY HOLLAND 06/16/14 0830 BY SMITHERSJ Performed at [facility]    Report Status PENDING  Incomplete  MRSA PCR Screening     Status: Abnormal   Collection Time: 06/23/2014 10:46 AM  Result Value Ref Range Status   MRSA by PCR POSITIVE (A) NEGATIVE Final    Comment:        The GeneXpert MRSA Assay (FDA approved for NASAL specimens only), is one component of a comprehensive MRSA colonization surveillance program. It is not intended to diagnose MRSA infection nor to guide or monitor treatment for MRSA infections. RESULT CALLED TO, READ BACK BY AND VERIFIED WITH: J.BEABRAUT RN [id number] @ 7510 VESTALM   Culture, respiratory (NON-Expectorated)     Status: None (Preliminary result)   Collection Time: 07/04/2014 11:25 AM  Result Value Ref Range Status   Specimen Description TRACHEAL ASPIRATE  Final   Special Requests NONE  Final   Gram Stain   Final    RARE WBC PRESENT, PREDOMINANTLY MONONUCLEAR RARE SQUAMOUS EPITHELIAL CELLS PRESENT NO ORGANISMS SEEN Performed at [facility]    Culture   Final    Non-Pathogenic Oropharyngeal-type Flora Isolated. Performed at [facility]    Report Status PENDING  Incomplete  Urine culture     Status: None   Collection Time: 06/25/2014  2:25 PM  Result Value Ref Range Status   Specimen Description URINE, RANDOM  Final   Special Requests Normal  Final   Colony Count NO GROWTH Performed at [facility]   Final   Culture NO GROWTH Performed at [facility]   Final   Report Status 06/15/2014 FINAL  Final    Coagulation Studies:  Recent Labs  07/05/2014 0728  LABPROT 17.5*  INR 1.42     Imaging: Mr Brain Wo Contrast  06/15/2014   CLINICAL DATA:  Abnormal head CT.  Abnormal activity.  EXAM: MRI HEAD WITHOUT CONTRAST  TECHNIQUE: Multiplanar, multiecho pulse sequences of the brain and surrounding structures were obtained without intravenous contrast.  COMPARISON:  Head CT 06/23/2014  FINDINGS: Calvarium and upper cervical spine: No marrow signal abnormality.  Orbits: Bilateral cataract resection.  No acute findings.  Sinuses: Nasopharyngeal in nasal cavity fluid,  also present in the right maxillary sinus. This is considered secondary to intubation. Mastoid and middle ears are clear.  Brain: The left parietal cortical and subcortical abnormality in question is seen is T2 and FLAIR hyperintense signal abnormality with no volume loss or gain. There is no diffusion signal abnormality in this region or evidence of previous hemorrhage. 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 Patchy T2 and FLAIR signal hyperintensity throughout the bilateral cerebral white matter, abnormally extensive and likely related to chronic small vessel disease given patient age. White matter disease was also noted 04/14/2013. No hydrocephalus, mass lesion, or evidence of major vessel occlusion.  IMPRESSION: 1. No acute intracranial findings. The left parietal findings on previous head CT is consistent with a remote MCA branch vessel infarction. 2. Moderate nonspecific white matter disease, likely chronic small vessel ischemic. 3. Right maxillary effusion related to intubation.   Electronically Signed   By: Monte Fantasia M.D.   On: 06/15/2014 13:19   US Abdomen Complete  06/17/2014   CLINICAL DATA:  Polycythemia  EXAM: ULTRASOUND ABDOMEN COMPLETE  COMPARISON:  None.  FINDINGS: Gallbladder: Surgically absent  Common bile duct: Diameter: 3.4 mm.  Not comprehensively seen.  Liver: Suboptimally visualized. Liver has a coarsened echotexture. Questionable surface nodularity. No mass or focal lesion.   IVC: No abnormality visualized.  Pancreas: Not visualized  Spleen: Size and appearance within normal limits.  Right Kidney: Length: 13.3 cm. Not well visualized. Grossly normal in parenchymal echogenicity. No mass or hydronephrosis.  Left Kidney: Length: 13.8 cm. Not well visualized. Grossly normal in parenchymal echogenicity. No mass or hydronephrosis.  Abdominal aorta: Not visualized.  Other findings: Small amount ascites.  IMPRESSION: 1. Significant bowel gas obscures midline structures. 2. Coarsened liver echotexture and questionable surface nodularity. Consider cirrhosis in the proper clinical setting. 3. Spleen normal in size. 4. Status post cholecystectomy. 5. Small amount of ascites.   Electronically Signed   By: Lajean Manes M.D.   On: 07/04/2014 17:45   Dg Chest Port 1 View  06/16/2014   CLINICAL DATA:  Respiratory failure.  EXAM: PORTABLE CHEST - 1 VIEW  COMPARISON:  06/12/2014.  FINDINGS: Endotracheal tube in satisfactory position. Right jugular catheter tip in the superior vena cava. Nasogastric tube extending into the stomach. Stable enlarged cardiac silhouette and prominent pulmonary vasculature and interstitial markings. Increased patchy opacity at both lung bases. Moderate size right pleural effusion without significant change. Probable small left pleural effusion.  IMPRESSION: 1. Increased bibasilar atelectasis, pneumonia or alveolar edema. 2. Stable moderate-sized right pleural effusion with a interval probable small left pleural effusion. 3. Stable cardiomegaly, pulmonary vascular congestion and interstitial pulmonary edema.   Electronically Signed   By: Claudie Revering M.D.   On: 06/16/2014 08:32    Medications:  I have reviewed the patient's current medications. Scheduled: . antiseptic oral rinse  7 mL Mouth Rinse QID  . aspirin  81 mg Oral Daily  . chlorhexidine  15 mL Mouth Rinse BID  . Chlorhexidine Gluconate Cloth  6 each Topical Q0600  . feeding supplement (VITAL HIGH PROTEIN)   1,000 mL Per Tube Q24H  . heparin subcutaneous  5,000 Units Subcutaneous 3 times per day  . hydrocerin   Topical BID  . hydrocortisone sodium succinate  50 mg Intravenous Q6H  . insulin aspart  0-15 Units Subcutaneous 6 times per day  . mupirocin cream   Topical BID  . mupirocin ointment  1 application Nasal BID  . pantoprazole sodium  40 mg Per Tube Daily  . piperacillin-tazobactam (ZOSYN)  IV  3.375 g Intravenous 3 times per day  . vancomycin  1,000 mg Intravenous Q24H    Assessment/Plan: Continued AMS. Multiple metabolic issues continue such as renal insufficiency and low sodium which are likely contributing. Antipsychotics have been discontinued. White blood cell count increased but stabilizing.Today moving both upper extremities. Patient on ASA. EEG only significant for slowing. MRI of the brain personally reviewed and shows nothing acute.  Area of concern on head CT corresponds to what appears to be a remote infarct Suspect AMS secondary to a metabolic encephalopathy.  Recommendations: 1. Continue ASA 2. Agree with continued correction of metabolic issues.   LOS: 2 days   Alexis Goodell, MD [facility] [phone] 06/16/2014  10:21 AM

## 2014-06-16 NOTE — Progress Notes (Signed)
CRITICAL VALUE ALERT  Critical value received:  Gram +cocci in clusters  Date of notification:  06/16/2014   Time of notification:  0830  Critical value read back:Yes.    Nurse who received alert:  Corrinne Eagle RN  MD notified (1st page):  Dr. Halford Chessman notified in person on unit(verified pt on vanc/zosyn-will review on rounds)   Time of first page:  1030   MD notified (2nd page):NA  Time of second page:NA  Responding MD:  Halford Chessman  Time MD responded:  1030

## 2014-06-16 NOTE — Progress Notes (Signed)
PULMONARY / CRITICAL CARE MEDICINE   Name: Ana Hampton MRN: [id number] DOB: 31-Jul-1950    ADMISSION DATE:  07/02/2014  REFERRING MD :  [name]  CHIEF COMPLAINT:  Unresponsive  INITIAL PRESENTATION: 64 year old with extensive psych history but otherwise not much is known who was acting different the day prior to presentation, family did not think much of it.  They went to sleep woke up this AM and found her unresponsive on the floor.  EMS was called and patient was brought to the ED.  She was not protecting her airway and [name] intubated patient.  PCCM was called to admit.  Little other history is available and no family bedside.  Patient was hypothermic on presentation, now febrile.  STUDIES:  5/06 echo >> EF 30 to 24%, grade 1 diastolic dysfx, PAS 43 mmHg 5/06 EEG >> moderate diffuse slowing 5/06 abd u/s >> coarse liver ?cirrhosis, small amount of ascites 5/07 MRI brain >> no acute findings, remote Lt MCA CVA  SIGNIFICANT EVENTS: 5/6 unresponsive, to ED and intubated, neuro consulted 5/7 3% NS  5/8 d/c 3% NS  SUBJECTIVE:  Remains on pressors.  VITAL SIGNS: Temp:  [97.5 F (36.4 C)-98.5 F (36.9 C)] 98.4 F (36.9 C) (05/08 0842) Pulse Rate:  [65-79] 71 (05/08 1100) Resp:  [14-20] 18 (05/08 1100) BP: (83-119)/(57-98) 83/57 mmHg (05/08 1000) SpO2:  [92 %-99 %] 96 % (05/08 1100) Arterial Line BP: (89-115)/(53-70) 115/70 mmHg (05/08 1100) FiO2 (%):  [40 %-50 %] 40 % (05/08 1100) Weight:  [251 lb 15.8 oz (114.3 kg)] 251 lb 15.8 oz (114.3 kg) (05/08 0352) HEMODYNAMICS: CVP:  [15 mmHg-20 mmHg] 18 mmHg VENTILATOR SETTINGS: Vent Mode:  [-] PRVC FiO2 (%):  [40 %-50 %] 40 % Set Rate:  [18 bmp] 18 bmp Vt Set:  [470 mL] 470 mL PEEP:  [5 cmH20] 5 cmH20 Plateau Pressure:  [18 cmH20-24 cmH20] 19 cmH20 INTAKE / OUTPUT:  Intake/Output Summary (Last 24 hours) at 06/16/14 1157 Last data filed at 06/16/14 1100  Gross per 24 hour  Intake 4085.9 ml  Output    490 ml  Net 3595.9 ml     PHYSICAL EXAMINATION: General:  Chronically ill appearing female, completely unresponsive. Neuro:  Unresponsive, does not withdraw to pain, has a respiratory drive, pupils are non-reactive and no corneal reflexes, scleral edema. HEENT:  [place]/AT, PERRL, EOM-I and MMM. Cardiovascular:  RRR, Nl S1/S2, -M/R/G (post cardioversion). Lungs:  Diffuse crackles in all lung fields. Abdomen:  Soft, NT, ND and +BS. Musculoskeletal:  Severe erythema diffusely, bilateral lower ext edema and venous stasis but pulses are weak and present. Skin:  Diffuse erythema, groin with erythema as well.  LABS:  CBC  Recent Labs Lab 06/17/2014 0728 06/28/2014 0747 06/15/14 0500 06/16/14 0316  WBC 10.3  --  12.4* 11.8*  HGB 20.5* 23.5* 19.3* 18.2*  HCT 56.7* 69.0* 54.1* 51.7*  PLT 116*  --  121* 125*   Coag's  Recent Labs Lab 06/13/2014 0728  INR 1.42   BMET  Recent Labs Lab 06/15/14 1910 06/16/14 0102 06/16/14 0316 06/16/14 0805  NA 114* 120* 119* 121*  K 3.8 3.9 4.1  --   CL 89* 88* 89*  --   CO2 19* 21* 19*  --   BUN 27* 30* 32*  --   CREATININE 1.74* 1.90* 1.98*  --   GLUCOSE 165* 160* 138*  --    Electrolytes  Recent Labs Lab 06/15/14 0500  06/15/14 1910 06/16/14 0102 06/16/14 0316  CALCIUM 7.8*  < > 7.6* 7.6* 8.0*  MG 1.8  --   --   --  1.9  PHOS 5.6*  --   --   --  5.4*  < > = values in this interval not displayed.   Sepsis Markers  Recent Labs Lab 06/29/2014 0747 06/25/2014 0830 06/15/14 0500 06/15/14 2115 06/16/14 0316  LATICACIDVEN 3.51*  --   --  1.5  --   PROCALCITON  --  1.06 1.93  --  1.94   ABG  Recent Labs Lab 06/17/2014 1008 06/15/14 0255 06/16/14 0500  PHART 7.219* 7.299* 7.302*  PCO2ART 55.8* 47.6* 44.7  PO2ART 131.0* 93.4 97.1   Liver Enzymes  Recent Labs Lab 07/09/2014 0728 06/16/14 0316  AST 73* 57*  ALT 36 31  ALKPHOS 200* 109  BILITOT 3.1* 2.2*  ALBUMIN 2.8* 2.0*   Cardiac Enzymes  Recent Labs Lab 06/11/2014 0830 06/22/2014 1400  06/13/2014 2034  TROPONINI 0.08* 0.09* 0.08*   Glucose  Recent Labs Lab 06/15/14 1632 06/15/14 1635 06/15/14 1640 06/15/14 1928 06/15/14 2350 06/16/14 0406  GLUCAP 21* 163* 165* 157* 154* 128*    Imaging Mr Brain Wo Contrast  06/15/2014   CLINICAL DATA:  Abnormal head CT.  Abnormal activity.  EXAM: MRI HEAD WITHOUT CONTRAST  TECHNIQUE: Multiplanar, multiecho pulse sequences of the brain and surrounding structures were obtained without intravenous contrast.  COMPARISON:  Head CT 06/18/2014  FINDINGS: Calvarium and upper cervical spine: No marrow signal abnormality.  Orbits: Bilateral cataract resection.  No acute findings.  Sinuses: Nasopharyngeal in nasal cavity fluid, also present in the right maxillary sinus. This is considered secondary to intubation. Mastoid and middle ears are clear.  Brain: The left parietal cortical and subcortical abnormality in question is seen is T2 and FLAIR hyperintense signal abnormality with no volume loss or gain. There is no diffusion signal abnormality in this region or evidence of previous hemorrhage. 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 Patchy T2 and FLAIR signal hyperintensity throughout the bilateral cerebral white matter, abnormally extensive and likely related to chronic small vessel disease given patient age. White matter disease was also noted 04/14/2013. No hydrocephalus, mass lesion, or evidence of major vessel occlusion.  IMPRESSION: 1. No acute intracranial findings. The left parietal findings on previous head CT is consistent with a remote MCA branch vessel infarction. 2. Moderate nonspecific white matter disease, likely chronic small vessel ischemic. 3. Right maxillary effusion related to intubation.   Electronically Signed   By: Monte Fantasia M.D.   On: 06/15/2014 13:19     ASSESSMENT / PLAN:  PULMONARY ETT 5/6>>> A:  VDRF due to inability to protect her airway, cause of AMS unknown at this time. P:   Full  vent support F/u CXR  CARDIOVASCULAR L IJ TLC 5/6>>> A:  Shock ?cardiogenic versus septic. Acute systolic heart failure. A-fib with associated hypotension that [name] cardioverted. P:  Might need cardiology consult Pressors to keep MAP > 65  RENAL A:   Hyponatremia >> in setting of CHF, and possible cirrhosis. P:   Continue NS IV fluid KVO Lasix 40 mg IV x one 5/08  GASTROINTESTINAL A:   Nutrition. ?cirrhosis changes on abd u/s. P:   Tube feeds Check hepatitis panel F/u LFT's intermittently  HEMATOLOGIC A:   Polycythemia ? From chronic hypoxia. P:  F/u CBC SQ heparin Might need hematology assessment if it does not improve further  INFECTIOUS A:   Pneumonia. GPC in blood culture. P:  Day 3 vancomycin, zosyn  BCx2 5/6>>> GPC >>  ENDOCRINE A:   Hyperglycemia. Relative adrenal insufficiency. Hypothyroidism >> TSH 5.063 from 5/07. P:   SSI Solu cortef Add low dose levothyroxine 5/08  NEUROLOGIC A:   Acute encephalopathy with concern for sub acute CVA. Hx of bipolar. P:   RASS goal -1  CC time 35 minutes  Chesley Mires, MD [place] 06/16/2014, 11:57 AM Pager:  [phone] After 3pm call: [phone]

## 2014-06-16 NOTE — Progress Notes (Signed)
ANTIBIOTIC CONSULT NOTE - Follow-up  Pharmacy Consult for Vancomycin and Zosyn Indication:  PNA  Allergies  Allergen Reactions  . Abilify [Aripiprazole] Shortness Of Breath  . Geodon [Ziprasidone Hcl]   . Niaspan [Niacin Er]   . Prednisone     Patient Measurements: Ht: 63 in Wt: 113 kg  Vital Signs: Temp: 97.6 F (36.4 C) (05/08 1709) Temp Source: Oral (05/08 1709) BP: 82/56 mmHg (05/08 1800) Pulse Rate: 69 (05/08 1800) Intake/Output from previous day: 05/07 0701 - 05/08 0700 In: 4782 [I.V.:3182; NG/GT:1275; IV Piggyback:325] Out: 545 [Urine:545] Intake/Output from this shift:    Labs:  Recent Labs  07/08/2014 0728 06/16/2014 0747 06/18/2014 1425  06/15/14 0500  06/15/14 1910 06/16/14 0102 06/16/14 0316  WBC 10.3  --   --   --  12.4*  --   --   --  11.8*  HGB 20.5* 23.5*  --   --  19.3*  --   --   --  18.2*  PLT 116*  --   --   --  121*  --   --   --  125*  LABCREA  --   --  103.16  --   --   --   --   --   --   CREATININE 0.84 0.80  --   < > 1.45*  < > 1.74* 1.90* 1.98*  < > = values in this interval not displayed. Estimated Creatinine Clearance: 36.4 mL/min (by C-G formula based on Cr of 1.98).  Recent Labs  06/15/14 1900 06/16/14 1812  VANCOTROUGH 30*  --   VANCORANDOM  --  25     Microbiology: Recent Results (from the past 720 hour(s))  Culture, blood (routine x 2)     Status: None (Preliminary result)   Collection Time: 06/30/2014  7:20 AM  Result Value Ref Range Status   Specimen Description BLOOD LEFT HAND  Final   Special Requests BOTTLES DRAWN AEROBIC AND ANAEROBIC 5CCS  Final   Culture   Final           BLOOD CULTURE RECEIVED NO GROWTH TO DATE CULTURE WILL BE HELD FOR 5 DAYS BEFORE ISSUING A FINAL NEGATIVE REPORT Performed at [facility]    Report Status PENDING  Incomplete  Culture, blood (routine x 2)     Status: None (Preliminary result)   Collection Time: 06/24/2014  7:28 AM  Result Value Ref Range Status   Specimen Description  BLOOD RIGHT HAND  Final   Special Requests BOTTLES DRAWN AEROBIC ONLY 2CCS  Final   Culture   Final    GRAM POSITIVE COCCI IN CLUSTERS Note: Gram Stain Report Called to,Read Back By and Verified With: JOY HOLLAND 06/16/14 0830 BY SMITHERSJ Performed at [facility]    Report Status PENDING  Incomplete  MRSA PCR Screening     Status: Abnormal   Collection Time: 06/25/2014 10:46 AM  Result Value Ref Range Status   MRSA by PCR POSITIVE (A) NEGATIVE Final    Comment:        The GeneXpert MRSA Assay (FDA approved for NASAL specimens only), is one component of a comprehensive MRSA colonization surveillance program. It is not intended to diagnose MRSA infection nor to guide or monitor treatment for MRSA infections. RESULT CALLED TO, READ BACK BY AND VERIFIED WITH: J.BEABRAUT RN [id number] @ [id number]   Culture, respiratory (NON-Expectorated)     Status: None   Collection Time: 07/06/2014 11:25 AM  Result Value Ref Range  Status   Specimen Description TRACHEAL ASPIRATE  Final   Special Requests NONE  Final   Gram Stain   Final    RARE WBC PRESENT, PREDOMINANTLY MONONUCLEAR RARE SQUAMOUS EPITHELIAL CELLS PRESENT NO ORGANISMS SEEN Performed at [facility]    Culture   Final    Non-Pathogenic Oropharyngeal-type Flora Isolated. Performed at [facility]    Report Status 06/16/2014 FINAL  Final  Urine culture     Status: None   Collection Time: 07/05/2014  2:25 PM  Result Value Ref Range Status   Specimen Description URINE, RANDOM  Final   Special Requests Normal  Final   Colony Count NO GROWTH Performed at [facility]   Final   Culture NO GROWTH Performed at [facility]   Final   Report Status 06/15/2014 FINAL  Final    Assessment: 64 y/o female continues on Vancomycin and Zosyn (Day #3) for PNA. Now with 1/2 bld growing GPC clusters. LA 3.5->1.5. WBC down to 11.8. Afeb. PCT 1->1.94.  5/7 1940 Vancomycin trough 30 mcg/ml on 1gm IV  q12h - renal function continuing to worsen 5/8 1815 Vancomycin random 25 mcg/ml Ke 0.008, half-life ~86 hrs  Vanc 5/6>>  Zosyn 5/6>>   5/6 BCx2 >1/2 GPC clusters 5/6 UCx >  5/6 Trach asp>ngtd  MRSA PCR positive  Nephrology: SCr trending up to 1.98, est normalized CrCl 35 ml/min. UOP down to 460/24h.  Goal of Therapy:  Vancomycin trough level 15-20 mcg/ml Eradication of infection  Plan:  - Hold vancomycin - Consider random level in ~48h if continues - Zosyn 3.375 g IV q8h to be infused over 4 hours - Monitor renal function, clinical progress, and culture data  Sherlon Handing, PharmD, BCPS Clinical pharmacist, pager [phone] 06/16/2014 7:42 PM

## 2014-06-16 NOTE — Progress Notes (Signed)
[place] Progress Note Patient Name: Ana Hampton DOB: 09/08/1950 MRN: [id number]   Date of Service  06/16/2014  HPI/Events of Note  3% saline started for Na of 114 - now 120.    eICU Interventions  Plan: Hold on 3% for now due to rapid correction Reduce NS to 50 cc/hr Recheck Na at 4am     Intervention Category Major Interventions: Electrolyte abnormality - evaluation and management  DETERDING,ELIZABETH 06/16/2014, 1:41 AM

## 2014-06-17 ENCOUNTER — Inpatient Hospital Stay (HOSPITAL_COMMUNITY): Payer: Medicare Other

## 2014-06-17 DIAGNOSIS — I429 Cardiomyopathy, unspecified: Secondary | ICD-10-CM | POA: Diagnosis present

## 2014-06-17 DIAGNOSIS — N179 Acute kidney failure, unspecified: Secondary | ICD-10-CM | POA: Diagnosis present

## 2014-06-17 LAB — BASIC METABOLIC PANEL
ANION GAP: 9 (ref 5–15)
Anion gap: 11 (ref 5–15)
BUN: 45 mg/dL — AB (ref 6–20)
BUN: 53 mg/dL — ABNORMAL HIGH (ref 6–20)
CHLORIDE: 89 mmol/L — AB (ref 101–111)
CO2: 21 mmol/L — ABNORMAL LOW (ref 22–32)
CO2: 23 mmol/L (ref 22–32)
CREATININE: 2.48 mg/dL — AB (ref 0.44–1.00)
Calcium: 8.2 mg/dL — ABNORMAL LOW (ref 8.9–10.3)
Calcium: 8.1 mg/dL — ABNORMAL LOW (ref 8.9–10.3)
Chloride: 91 mmol/L — ABNORMAL LOW (ref 101–111)
Creatinine, Ser: 2.8 mg/dL — ABNORMAL HIGH (ref 0.44–1.00)
GFR calc Af Amer: 23 mL/min — ABNORMAL LOW (ref >60)
GFR calc Af Amer: 20 mL/min — ABNORMAL LOW (ref >60)
GFR calc non Af Amer: 20 mL/min — ABNORMAL LOW (ref >60)
GFR calc non Af Amer: 17 mL/min — ABNORMAL LOW (ref >60)
GLUCOSE: 98 mg/dL (ref 70–99)
Glucose, Bld: 105 mg/dL — ABNORMAL HIGH (ref 70–99)
POTASSIUM: 4.2 mmol/L (ref 3.5–5.1)
POTASSIUM: 4.4 mmol/L (ref 3.5–5.1)
SODIUM: 123 mmol/L — AB (ref 135–145)
Sodium: 121 mmol/L — ABNORMAL LOW (ref 135–145)

## 2014-06-17 LAB — CBC
HEMATOCRIT: 51 % — AB (ref 36.0–46.0)
HEMOGLOBIN: 17.6 g/dL — AB (ref 12.0–15.0)
MCH: 28.7 pg (ref 26.0–34.0)
MCHC: 34.5 g/dL (ref 30.0–36.0)
MCV: 83.1 fL (ref 78.0–100.0)
PLATELETS: 118 10*3/uL — AB (ref 150–400)
RBC: 6.14 MIL/uL — ABNORMAL HIGH (ref 3.87–5.11)
RDW: 18.2 % — AB (ref 11.5–15.5)
WBC: 11 10*3/uL — ABNORMAL HIGH (ref 4.0–10.5)

## 2014-06-17 LAB — GLUCOSE, CAPILLARY
GLUCOSE-CAPILLARY: 114 mg/dL — AB (ref 70–99)
GLUCOSE-CAPILLARY: 115 mg/dL — AB (ref 70–99)
GLUCOSE-CAPILLARY: 132 mg/dL — AB (ref 70–99)
GLUCOSE-CAPILLARY: 96 mg/dL (ref 70–99)
Glucose-Capillary: 21 mg/dL — CL (ref 70–99)
Glucose-Capillary: 102 mg/dL — ABNORMAL HIGH (ref 70–99)
Glucose-Capillary: 121 mg/dL — ABNORMAL HIGH (ref 70–99)

## 2014-06-17 LAB — SODIUM
SODIUM: 121 mmol/L — AB (ref 135–145)
Sodium: 122 mmol/L — ABNORMAL LOW (ref 135–145)
Sodium: 121 mmol/L — ABNORMAL LOW (ref 135–145)

## 2014-06-17 LAB — CULTURE, BLOOD (ROUTINE X 2)

## 2014-06-17 MED ORDER — ASPIRIN 81 MG PO CHEW
81.0000 mg | CHEWABLE_TABLET | Freq: Every day | ORAL | Status: DC
  Administered 2014-06-18 – 2014-06-24 (×6): 81 mg
  Filled 2014-06-17 (×7): qty 1

## 2014-06-17 MED ORDER — BUDESONIDE 0.25 MG/2ML IN SUSP
0.2500 mg | Freq: Four times a day (QID) | RESPIRATORY_TRACT | Status: DC
  Administered 2014-06-17 – 2014-06-24 (×28): 0.25 mg via RESPIRATORY_TRACT
  Filled 2014-06-17 (×32): qty 2

## 2014-06-17 MED ORDER — MILRINONE IN DEXTROSE 20 MG/100ML IV SOLN
0.1250 ug/kg/min | INTRAVENOUS | Status: DC
  Administered 2014-06-17 – 2014-06-20 (×4): 0.125 ug/kg/min via INTRAVENOUS
  Filled 2014-06-17 (×5): qty 100

## 2014-06-17 MED ORDER — ALBUTEROL SULFATE (2.5 MG/3ML) 0.083% IN NEBU: 2.5000 mg | INHALATION_SOLUTION | RESPIRATORY_TRACT | Status: DC | PRN

## 2014-06-17 MED ORDER — FUROSEMIDE 10 MG/ML IJ SOLN
8.0000 mg/h | INTRAVENOUS | Status: DC
  Administered 2014-06-17: 8 mg/h via INTRAVENOUS
  Filled 2014-06-17 (×2): qty 25

## 2014-06-17 MED ORDER — IPRATROPIUM-ALBUTEROL 0.5-2.5 (3) MG/3ML IN SOLN
3.0000 mL | Freq: Four times a day (QID) | RESPIRATORY_TRACT | Status: DC
  Administered 2014-06-17 – 2014-06-24 (×28): 3 mL via RESPIRATORY_TRACT
  Filled 2014-06-17 (×28): qty 3

## 2014-06-17 MED ORDER — ASPIRIN 81 MG PO CHEW: 81.0000 mg | CHEWABLE_TABLET | Freq: Every day | ORAL | Status: DC

## 2014-06-17 MED ORDER — FAMOTIDINE 40 MG/5ML PO SUSR
20.0000 mg | Freq: Every day | ORAL | Status: DC
  Administered 2014-06-18 – 2014-06-24 (×7): 20 mg
  Filled 2014-06-17 (×8): qty 2.5

## 2014-06-17 MED ORDER — LEVOTHYROXINE SODIUM 25 MCG PO TABS
25.0000 ug | ORAL_TABLET | Freq: Every day | ORAL | Status: DC
  Administered 2014-06-18: 25 ug
  Filled 2014-06-17 (×2): qty 1

## 2014-06-17 MED ORDER — ATORVASTATIN CALCIUM 40 MG PO TABS
40.0000 mg | ORAL_TABLET | Freq: Every day | ORAL | Status: DC
  Administered 2014-06-17 – 2014-06-25 (×8): 40 mg
  Filled 2014-06-17 (×10): qty 1

## 2014-06-17 NOTE — Progress Notes (Signed)
PULMONARY / CRITICAL CARE MEDICINE   Name: Ana Hampton MRN: [id number] DOB: [birth year]    ADMISSION DATE:  07/03/2014  REFERRING MD :  [name]  CHIEF COMPLAINT:  Unresponsive  INITIAL PRESENTATION: 64 year old with extensive psych history but otherwise not much is known who was acting different the day prior to presentation, family did not think much of it.  They went to sleep woke up this AM and found her unresponsive on the floor.  EMS was called and patient was brought to the ED.  She was not protecting her airway and [name] intubated patient.  PCCM was called to admit.  Little other history is available and no family bedside.  Patient was hypothermic on presentation, now febrile.  STUDIES:  5/06 echo >> EF 30 to 57%, grade 1 diastolic dysfx, PAS 43 mmHg 5/06 EEG >> moderate diffuse slowing 5/06 abd u/s >> coarse liver ?cirrhosis, small amount of ascites 5/07 MRI brain >> no acute findings, remote Lt MCA CVA  SIGNIFICANT EVENTS: 5/6 unresponsive, to ED and intubated, neuro consulted 5/7 3% NS  5/8 d/c 3% NS  SUBJECTIVE:  Remains minimally responsive. Coming off vasopressors  VITAL SIGNS: Temp:  [97.3 F (36.3 C)-98.2 F (36.8 C)] 97.3 F (36.3 C) (05/09 1711) Pulse Rate:  [64-80] 78 (05/09 1900) Resp:  [0-27] 12 (05/09 1900) BP: (80-101)/(45-69) 99/57 mmHg (05/09 1900) SpO2:  [81 %-99 %] 98 % (05/09 1900) Arterial Line BP: (84-125)/(48-67) 122/60 mmHg (05/09 1900) FiO2 (%):  [40 %] 40 % (05/09 1600) Weight:  [116 kg (255 lb 11.7 oz)] 116 kg (255 lb 11.7 oz) (05/09 0401) HEMODYNAMICS: CVP:  [16 mmHg-49 mmHg] 46 mmHg VENTILATOR SETTINGS: Vent Mode:  [-] PRVC FiO2 (%):  [40 %] 40 % Set Rate:  [18 bmp] 18 bmp Vt Set:  [470 mL] 470 mL PEEP:  [5 cmH20] 5 cmH20 Plateau Pressure:  [17 cmH20-28 cmH20] 27 cmH20 INTAKE / OUTPUT:  Intake/Output Summary (Last 24 hours) at 06/17/14 1919 Last data filed at 06/17/14 1800  Gross per 24 hour  Intake 1657.64 ml  Output    470 ml  Net  1187.64 ml    PHYSICAL EXAMINATION: General: Obese, RASS -3, not F/C Neuro: MAEs HEENT:  [place]/AT Cardiovascular:  Reg, no M Lungs:  Diffuse crackles in all lung fields. Abdomen:  Soft, NT, ND and +BS. Musculoskeletal:  Severe erythema diffusely, bilateral lower ext edema and venous stasis but pulses are weak and present. Skin:  Diffuse erythema, groin with erythema as well.  LABS:  CBC  Recent Labs Lab 06/15/14 0500 06/16/14 0316 06/17/14 0440  WBC 12.4* 11.8* 11.0*  HGB 19.3* 18.2* 17.6*  HCT 54.1* 51.7* 51.0*  PLT 121* 125* 118*   Coag's  Recent Labs Lab 06/30/2014 0728  INR 1.42   BMET  Recent Labs Lab 06/16/14 0102 06/16/14 0316  06/17/14 0440 06/17/14 0747 06/17/14 1115  NA 120* 119*  < > 121* 121* 121*  K 3.9 4.1  --  4.2  --   --   CL 88* 89*  --  89*  --   --   CO2 21* 19*  --  21*  --   --   BUN 30* 32*  --  45*  --   --   CREATININE 1.90* 1.98*  --  2.48*  --   --   GLUCOSE 160* 138*  --  98  --   --   < > = values in this interval not displayed. Electrolytes  Recent Labs Lab 06/15/14 0500  06/16/14 0102 06/16/14 0316 06/17/14 0440  CALCIUM 7.8*  < > 7.6* 8.0* 8.2*  MG 1.8  --   --  1.9  --   PHOS 5.6*  --   --  5.4*  --   < > = values in this interval not displayed.   Sepsis Markers  Recent Labs Lab 06/25/2014 0747 07/03/2014 0830 06/15/14 0500 06/15/14 2115 06/16/14 0316  LATICACIDVEN 3.51*  --   --  1.5  --   PROCALCITON  --  1.06 1.93  --  1.94   ABG  Recent Labs Lab 06/30/2014 1008 06/15/14 0255 06/16/14 0500  PHART 7.219* 7.299* 7.302*  PCO2ART 55.8* 47.6* 44.7  PO2ART 131.0* 93.4 97.1   Liver Enzymes  Recent Labs Lab 06/10/2014 0728 06/16/14 0316  AST 73* 57*  ALT 36 31  ALKPHOS 200* 109  BILITOT 3.1* 2.2*  ALBUMIN 2.8* 2.0*   Cardiac Enzymes  Recent Labs Lab 06/25/2014 0830 06/22/2014 1400 06/17/2014 2034  TROPONINI 0.08* 0.09* 0.08*   Glucose  Recent Labs Lab 06/16/14 2024 06/17/14 0016 06/17/14 0350  06/17/14 0740 06/17/14 1122 06/17/14 1712  GLUCAP 163* 132* 102* 121* 96 114*   CXR: CM, edema   ASSESSMENT / PLAN:  PULMONARY ETT 5/06 >>  A: Acute (?on chronic) resp failure Pulm edema P:   Cont full vent support - settings reviewed and/or adjusted Cont vent bundle Daily SBT if/when meets criteria  CARDIOVASCULAR L IJ TLC 5/6 >> A:  Shock Suspect cardiogenic Severe cardiomyopathy Acute systolic heart failure A-fib with associated hypotension that [name] cardioverted. P:  Cont pressors to keep MAP > 65 Lasix gtt 5/09 Milrinone gtt 5/09  RENAL A:   Hyponatremia, multifactorial AKI Severe hypervolemia Not a candidate for HD based on very poor functional status at baseline P:   Monitor BMET intermittently Monitor I/Os Correct electrolytes as indicated Lasix gtt 5/09  GASTROINTESTINAL A:   Obesity P:   SUP: famotidine Cont TFs  HEMATOLOGIC A:   Polycythemia, suspect chronic hypoxia. P:  DVT px: SQ heparin Monitor CBC intermittently Transfuse per usual ICU guidelines  INFECTIOUS A:   Mildly elevated PCT of unclear etiology "+" BC is contaminant Possible PNA P:   Day 4 zosyn  Micro reviewed  ENDOCRINE A:   Hyperglycemia without prior DM history Hypothyroidism >> TSH 5.063 from 5/07. P:   Cont SSI Cont L thyroxine  NEUROLOGIC A:   Acute encephalopathy with concern for sub acute CVA Hx of bipolar. P:   RASS goal -1  Half brother with whom she lives updated in detail. We discussed her poor prognosis in light of poor baseline health status and very severe acute critical illness. We agree to continue our current efforts but no escalation inc no ACLS in event of cardiac arrest and no HD   CC time 45 minutes  Merton Border, MD ; [facility] [phone].  After 5:30 PM or weekends, call [phone]

## 2014-06-17 NOTE — Progress Notes (Signed)
Subjective: Intubated.   Exam: Filed Vitals:   06/17/14 0900  BP: 84/56  Pulse: 69  Temp:   Resp: 18    HEENT-  Normocephalic, no lesions, without obvious abnormality.  Normal external eye and conjunctiva.  Normal TM's bilaterally.  Normal auditory canals and external ears. Normal external nose, mucus membranes and septum.  Normal pharynx. Cardiovascular- S1, S2 normal, pulses palpable throughout   Lungs- no tachypnea, retractions or cyanosis Abdomen- normal findings: bowel sounds normal Extremities- bilateral LE edema and discoloration.  Lymph-no adenopathy palpable Musculoskeletal-positive tenderness when moving arms and legs.  Skin-warm and dry, no hyperpigmentation, vitiligo, or suspicious lesions and bilatearl LE dicoloeration and warm to touch.     Gen: In bed, NAD MS: Intubated on prn Fentanyl and Versed. Opens eyes to voice but follows no commands.  PH:XTAV symmetrical, blinks to threat, Pupils 52mm bilaterally reactive, oculocephalic reflex intact, grimaces symmetrically to pain Motor: moves bilateral UE antigravity (left > right) withdraws bilateral legs antigravity to pain but less than UE.   Sensory:intact throughout DTR: depressed throughout  Pertinent Labs: Na+ 121 Cl 89 BUN 45 Cr 2.48 GFR 20 WBC 11   Etta Quill PA-C [facility] [phone]  Impression: Continues AMS.  Recent Na= 121 and BUN/Cr 45/2.48.  Currently on Zosyn for pneumonia and GPC in blood culture. Continues to move bilateral UE.  EEG showed slowing and MRI showed no acute infarct. With the timeframe of her improvemetn, I wonder about a possible risperdal ingestion as this could cause an encephalopath along this time course. Water ingestion(psychogenic polydipsia) with acute hyponatremia is also possible but I would expect her sodium to be improving at this point. Certainly, renal and hepatic insufficinecy could alos be contributing factors.     Recommendations: 1) continue  present management.    Roland Rack, MD [facility] [phone]  If 7pm- 7am, please page neurology on call as listed in [place].  06/17/2014, 9:19 AM

## 2014-06-17 NOTE — Progress Notes (Signed)
Utilization review completed.  

## 2014-06-18 ENCOUNTER — Inpatient Hospital Stay (HOSPITAL_COMMUNITY): Payer: Medicare Other

## 2014-06-18 LAB — CBC
HCT: 47.5 % — ABNORMAL HIGH (ref 36.0–46.0)
Hemoglobin: 16.2 g/dL — ABNORMAL HIGH (ref 12.0–15.0)
MCH: 28.4 pg (ref 26.0–34.0)
MCHC: 34.1 g/dL (ref 30.0–36.0)
MCV: 83.3 fL (ref 78.0–100.0)
Platelets: 106 10*3/uL — ABNORMAL LOW (ref 150–400)
RBC: 5.7 MIL/uL — AB (ref 3.87–5.11)
RDW: 18.4 % — ABNORMAL HIGH (ref 11.5–15.5)
WBC: 8.6 10*3/uL (ref 4.0–10.5)

## 2014-06-18 LAB — GLUCOSE, CAPILLARY
GLUCOSE-CAPILLARY: 135 mg/dL — AB (ref 70–99)
GLUCOSE-CAPILLARY: 130 mg/dL — AB (ref 70–99)
Glucose-Capillary: 100 mg/dL — ABNORMAL HIGH (ref 70–99)
Glucose-Capillary: 138 mg/dL — ABNORMAL HIGH (ref 70–99)
Glucose-Capillary: 147 mg/dL — ABNORMAL HIGH (ref 70–99)

## 2014-06-18 LAB — ERYTHROPOIETIN: Erythropoietin: 65.1 m[IU]/mL — ABNORMAL HIGH (ref 2.6–18.5)

## 2014-06-18 LAB — BASIC METABOLIC PANEL
ANION GAP: 9 (ref 5–15)
BUN: 56 mg/dL — ABNORMAL HIGH (ref 6–20)
CALCIUM: 7.9 mg/dL — AB (ref 8.9–10.3)
CO2: 23 mmol/L (ref 22–32)
Chloride: 90 mmol/L — ABNORMAL LOW (ref 101–111)
Creatinine, Ser: 2.91 mg/dL — ABNORMAL HIGH (ref 0.44–1.00)
GFR calc Af Amer: 19 mL/min — ABNORMAL LOW (ref >60)
GFR calc non Af Amer: 16 mL/min — ABNORMAL LOW (ref >60)
Glucose, Bld: 122 mg/dL — ABNORMAL HIGH (ref 70–99)
Potassium: 4.2 mmol/L (ref 3.5–5.1)
Sodium: 122 mmol/L — ABNORMAL LOW (ref 135–145)

## 2014-06-18 MED ORDER — DEXMEDETOMIDINE HCL IN NACL 400 MCG/100ML IV SOLN
0.4000 ug/kg/h | INTRAVENOUS | Status: DC
  Administered 2014-06-18: 0.4 ug/kg/h via INTRAVENOUS
  Administered 2014-06-18: 0.6 ug/kg/h via INTRAVENOUS
  Administered 2014-06-19: 0.8 ug/kg/h via INTRAVENOUS
  Administered 2014-06-19: 1 ug/kg/h via INTRAVENOUS
  Administered 2014-06-19 (×2): 0.8 ug/kg/h via INTRAVENOUS
  Administered 2014-06-19 – 2014-06-20 (×2): 0.9 ug/kg/h via INTRAVENOUS
  Administered 2014-06-20: 1.2 ug/kg/h via INTRAVENOUS
  Administered 2014-06-20 (×2): 0.9 ug/kg/h via INTRAVENOUS
  Administered 2014-06-20: 1.1 ug/kg/h via INTRAVENOUS
  Administered 2014-06-21: 1 ug/kg/h via INTRAVENOUS
  Administered 2014-06-21 (×3): 1.2 ug/kg/h via INTRAVENOUS
  Administered 2014-06-22 (×2): 1.1 ug/kg/h via INTRAVENOUS
  Administered 2014-06-22: 0.7 ug/kg/h via INTRAVENOUS
  Administered 2014-06-22 – 2014-06-23 (×6): 1.1 ug/kg/h via INTRAVENOUS
  Administered 2014-06-23: 1.2 ug/kg/h via INTRAVENOUS
  Administered 2014-06-23 (×2): 1.1 ug/kg/h via INTRAVENOUS
  Administered 2014-06-24: 1 ug/kg/h via INTRAVENOUS
  Administered 2014-06-24: 1.2 ug/kg/h via INTRAVENOUS
  Administered 2014-06-24: 1 ug/kg/h via INTRAVENOUS
  Filled 2014-06-18 (×2): qty 100
  Filled 2014-06-18: qty 200
  Filled 2014-06-18: qty 100
  Filled 2014-06-18: qty 200
  Filled 2014-06-18 (×9): qty 100
  Filled 2014-06-18: qty 200
  Filled 2014-06-18 (×5): qty 100
  Filled 2014-06-18: qty 50
  Filled 2014-06-18 (×6): qty 100
  Filled 2014-06-18: qty 200
  Filled 2014-06-18 (×5): qty 100

## 2014-06-18 MED ORDER — LEVOTHYROXINE SODIUM 50 MCG PO TABS
50.0000 ug | ORAL_TABLET | Freq: Every day | ORAL | Status: DC
  Administered 2014-06-19 – 2014-06-24 (×6): 50 ug
  Filled 2014-06-18 (×8): qty 1

## 2014-06-18 NOTE — Progress Notes (Signed)
Subjective: Intubated and on sedation.   Exam: Filed Vitals:   06/18/14 0821  BP:   Pulse:   Temp: 97.5 F (36.4 C)  Resp:     HEENT- Normocephalic, no lesions, without obvious abnormality. Normal external eye and conjunctiva. Normal TM's bilaterally. Normal auditory canals and external ears. Normal external nose, mucus membranes and septum. Normal pharynx. Cardiovascular- S1, S2 normal, pulses palpable throughout  Lungs- no tachypnea, retractions or cyanosis Abdomen- normal findings: bowel sounds normal Extremities- bilateral LE and UE edema and discoloration.  Lymph-no adenopathy palpable Musculoskeletal-positive tenderness when moving arms and legs.  Skin-warm and dry, no hyperpigmentation, vitiligo, or suspicious lesions and bilatearl LE dicoloeration and warm to touch.     Gen: In bed, intubated MS: Intubated on prn Fentanyl and Versed. Opens eyes to voice and looks toward voice but follows no commands.  TL:XBWI symmetrical, blinks to threat, Pupils 28mm bilaterally reactive, oculocephalic reflex intact, grimaces symmetrically to pain Motor: no movement noted bilaterally with increased tone on the left arm and leg compared to the right Sensory: grimaces from pain in all extremities DTR: depressed throughout  Pertinent Labs: Na+ 122 Cl 90 Bun 56 Cr 2.91  Etta Quill PA-C [facility] [phone]  Impression: Continues to have AMS.  Na+ has not improved over night and BUN/Cr has worsened. Currently on Zosyn for pneumonia and GPC in blood culture. Continues to move bilateral UE. EEG showed slowing and MRI showed no acute infarct.   I discussed with brother possibility of taking too much risperdal, but he is not sure.    Recommendations: 1) Continue supportive care.     Roland Rack, MD [facility] [phone]  If 7pm- 7am, please page neurology on call as listed in [place]. 06/18/2014, 9:14 AM

## 2014-06-18 NOTE — Progress Notes (Signed)
[place] Progress Note Patient Name: Ana Hampton DOB: [birth year] MRN: [id number]   Date of Service  06/18/2014  HPI/Events of Note  Patient intubated and mechanically ventilated. Currently on Fentanyl and Versed IV Q 2 hours IV. Bedside nurse states that when patient gets light on sedation, she is reaching for her ETT.   eICU Interventions  Will order: 1. Precedex IV infusion titrated to RASS = 0. Would try to go light on the Fentanyl and Versed IV. The patient needs to be comfortable and not heavily sedated.      Intervention Category Major Interventions: Delirium, psychosis, severe agitation - evaluation and management  Xyler Terpening [name] 06/18/2014, 7:59 PM

## 2014-06-18 NOTE — Progress Notes (Signed)
PULMONARY / CRITICAL CARE MEDICINE   Name: Ana Hampton MRN: [id number] DOB: Apr 11, 1950    ADMISSION DATE:  06/22/2014  REFERRING MD :  [name]  CHIEF COMPLAINT:  Unresponsive  INITIAL PRESENTATION: 64 year old with extensive psych history adm via ED with AMS. She was not protecting her airway and [name] intubated patient. PCCM was called to admit. Patient was hypothermic on presentation. Multiple metabolic derangements  STUDIES:  5/06 TTE: EF 30 to 40%, grade 1 diastolic dysfx, PAS 43 mmHg 5/06 EEG: moderate diffuse slowing 5/06 Abd [place]: coarse liver ?cirrhosis, small amount of ascites 5/06 CT head: No CT evidence of acute intracranial abnormality 5/07 MRI brain: no acute findings, remote L MCA CVA  SIGNIFICANT EVENTS: 5/06 unresponsive, to ED and intubated, neuro consulted. Hyponatremia, polycythemia, suspected PNA 5/07 3% NS initiated 5/08 3% NS discontinued 5/09 Furosemide, milrinone infusions initiated. Discussion with her half brother (who has been her principle care provider) re: goals of care. No CPR/ACLS. No HD. Reassess progress in a couple of days and consider discontinuation of vent if no significant improvement  SUBJECTIVE:  RASS -2 to +1. Tolerates PS 10 cm H2O  VITAL SIGNS: Temp:  [97.3 F (36.3 C)-97.8 F (36.6 C)] 97.4 F (36.3 C) (05/10 1551) Pulse Rate:  [75-121] 93 (05/10 1715) Resp:  [10-25] 20 (05/10 1715) BP: (78-123)/(39-58) 109/52 mmHg (05/10 1700) SpO2:  [91 %-100 %] 93 % (05/10 1715) Arterial Line BP: (88-260)/(39-72) 130/51 mmHg (05/10 1745) FiO2 (%):  [40 %] 40 % (05/10 1700) Weight:  [115 kg (253 lb 8.5 oz)] 115 kg (253 lb 8.5 oz) (05/10 0408) HEMODYNAMICS: CVP:  [46 mmHg] 46 mmHg VENTILATOR SETTINGS: Vent Mode:  [-] CPAP;PSV FiO2 (%):  [40 %] 40 % Set Rate:  [18 bmp] 18 bmp Vt Set:  [470 mL] 470 mL PEEP:  [5 cmH20] 5 cmH20 Pressure Support:  [10 cmH20] 10 cmH20 Plateau Pressure:  [12 cmH20-23 cmH20] 20 cmH20 INTAKE / OUTPUT:  Intake/Output  Summary (Last 24 hours) at 06/18/14 1757 Last data filed at 06/18/14 1700  Gross per 24 hour  Intake 2429.03 ml  Output    675 ml  Net 1754.03 ml    PHYSICAL EXAMINATION: General: Obese, RASS -2, not F/C Neuro: MAEs HEENT:  [place]/AT Cardiovascular:  Reg, no M Lungs:  Diffuse crackles in all lung fields. Abdomen:  Soft, NT, ND and +BS. Ext: Severe erythema diffusely, bilateral lower ext edema and venous stasis but pulses are weak and present.  LABS:  CBC  Recent Labs Lab 06/16/14 0316 06/17/14 0440 06/18/14 0433  WBC 11.8* 11.0* 8.6  HGB 18.2* 17.6* 16.2*  HCT 51.7* 51.0* 47.5*  PLT 125* 118* 106*   Coag's  Recent Labs Lab 06/20/2014 0728  INR 1.42   BMET  Recent Labs Lab 06/17/14 0440  06/17/14 1115 06/17/14 1900 06/18/14 0433  NA 121*  < > 121* 123* 122*  K 4.2  --   --  4.4 4.2  CL 89*  --   --  91* 90*  CO2 21*  --   --  23 23  BUN 45*  --   --  53* 56*  CREATININE 2.48*  --   --  2.80* 2.91*  GLUCOSE 98  --   --  105* 122*  < > = values in this interval not displayed. Electrolytes  Recent Labs Lab 06/15/14 0500  06/16/14 0316 06/17/14 0440 06/17/14 1900 06/18/14 0433  CALCIUM 7.8*  < > 8.0* 8.2* 8.1* 7.9*  MG  1.8  --  1.9  --   --   --   PHOS 5.6*  --  5.4*  --   --   --   < > = values in this interval not displayed.   Sepsis Markers  Recent Labs Lab 07/03/2014 0747 06/10/2014 0830 06/15/14 0500 06/15/14 2115 06/16/14 0316  LATICACIDVEN 3.51*  --   --  1.5  --   PROCALCITON  --  1.06 1.93  --  1.94   ABG  Recent Labs Lab 06/13/2014 1008 06/15/14 0255 06/16/14 0500  PHART 7.219* 7.299* 7.302*  PCO2ART 55.8* 47.6* 44.7  PO2ART 131.0* 93.4 97.1   Liver Enzymes  Recent Labs Lab 06/30/2014 0728 06/16/14 0316  AST 73* 57*  ALT 36 31  ALKPHOS 200* 109  BILITOT 3.1* 2.2*  ALBUMIN 2.8* 2.0*   Cardiac Enzymes  Recent Labs Lab 06/10/2014 0830 07/06/2014 1400 06/15/2014 2034  TROPONINI 0.08* 0.09* 0.08*   Glucose  Recent Labs Lab  06/17/14 1712 06/17/14 2050 06/18/14 0101 06/18/14 0815 06/18/14 1201 06/18/14 1542  GLUCAP 114* 115* 100* 138* 147* 135*   CXR: CM, increased edema   ASSESSMENT / PLAN:  PULMONARY ETT 5/06 >>  A: Acute (?on chronic) resp failure Pulm edema P:   Cont vent support - settings reviewed and/or adjusted PSV as tolerated Cont vent bundle Daily SBT if/when meets criteria No extubation until cognition improves or until DNI established  CARDIOVASCULAR L IJ TLC 5/6 >>  A:  Shock, suspect cardiogenic Severe cardiomyopathy Acute systolic heart failure A-fib with associated hypotension that [name] cardioverted. P:  Cont pressors to keep MAP > 65 Cont Milrinone gtt 5/09 -   RENAL A:   Hyponatremia, multifactorial AKI,  Severe hypervolemia Not a candidate for HD based on very poor functional status at baseline P:   Monitor BMET intermittently Monitor I/Os Correct electrolytes as indicated Furosemide gtt 5/09 - 5/10  GASTROINTESTINAL A:   Obesity P:   SUP: enteral famotidine Cont TFs  HEMATOLOGIC A:   Polycythemia, suspect chronic hypoxia. P:  DVT px: SQ heparin Monitor CBC intermittently Transfuse per usual ICU guidelines  INFECTIOUS A:   Mildly elevated PCT of unclear etiology "+" BC is contaminant Possible PNA P:   MRSA PCR 5/06 >> POS Urine 5/06 >> NEG Resp 5/06 >> NOF Blood 5/06 >> 1/2 coag neg staph (contaminant)  Vanc 5/06 >> 5/08 Pip-tazo 5/06 >>   Recheck PCT in AM 5/11  ENDOCRINE A:   Hyperglycemia without prior DM history Hypothyroidism >> TSH 5.063 from 5/07. P:   Cont SSI Cont L thyroxine - increased 5/10  NEUROLOGIC A:   Acute encephalopathy - multifactorial Hx of bipolar disorder P:   RASS goal: 0 to-1   CC time 35 minutes  Merton Border, MD ; [facility] [phone].  After 5:30 PM or weekends, call [phone]

## 2014-06-19 ENCOUNTER — Inpatient Hospital Stay (HOSPITAL_COMMUNITY): Payer: Medicare Other

## 2014-06-19 LAB — GLUCOSE, CAPILLARY
GLUCOSE-CAPILLARY: 106 mg/dL — AB (ref 70–99)
Glucose-Capillary: 105 mg/dL — ABNORMAL HIGH (ref 70–99)
Glucose-Capillary: 107 mg/dL — ABNORMAL HIGH (ref 70–99)
Glucose-Capillary: 109 mg/dL — ABNORMAL HIGH (ref 70–99)
Glucose-Capillary: 112 mg/dL — ABNORMAL HIGH (ref 70–99)
Glucose-Capillary: 115 mg/dL — ABNORMAL HIGH (ref 70–99)

## 2014-06-19 LAB — BASIC METABOLIC PANEL
ANION GAP: 11 (ref 5–15)
BUN: 65 mg/dL — ABNORMAL HIGH (ref 6–20)
CALCIUM: 8.2 mg/dL — AB (ref 8.9–10.3)
CHLORIDE: 91 mmol/L — AB (ref 101–111)
CO2: 24 mmol/L (ref 22–32)
CREATININE: 3.22 mg/dL — AB (ref 0.44–1.00)
GFR calc Af Amer: 17 mL/min — ABNORMAL LOW (ref >60)
GFR calc non Af Amer: 14 mL/min — ABNORMAL LOW (ref >60)
GLUCOSE: 90 mg/dL (ref 70–99)
Potassium: 4.3 mmol/L (ref 3.5–5.1)
Sodium: 126 mmol/L — ABNORMAL LOW (ref 135–145)

## 2014-06-19 LAB — CBC
HCT: 46.2 % — ABNORMAL HIGH (ref 36.0–46.0)
HEMOGLOBIN: 16.1 g/dL — AB (ref 12.0–15.0)
MCH: 29.3 pg (ref 26.0–34.0)
MCHC: 34.8 g/dL (ref 30.0–36.0)
MCV: 84 fL (ref 78.0–100.0)
PLATELETS: 90 10*3/uL — AB (ref 150–400)
RBC: 5.5 MIL/uL — AB (ref 3.87–5.11)
RDW: 18.6 % — ABNORMAL HIGH (ref 11.5–15.5)
WBC: 6.7 10*3/uL (ref 4.0–10.5)

## 2014-06-19 LAB — PROCALCITONIN: Procalcitonin: 0.86 ng/mL

## 2014-06-19 MED ORDER — SODIUM CHLORIDE 0.9 % IV SOLN
INTRAVENOUS | Status: DC
  Administered 2014-06-20: 125 mL/h via INTRAVENOUS

## 2014-06-19 NOTE — Progress Notes (Signed)
ANTIBIOTIC CONSULT NOTE - FOLLOW UP  Pharmacy Consult:  Zosyn Indication:  Sepsis + PNA  Allergies  Allergen Reactions  . Abilify [Aripiprazole] Shortness Of Breath  . Geodon [Ziprasidone Hcl]   . Niaspan [Niacin Er]   . Prednisone     Patient Measurements: Height: 5\' 3"  (160 cm) Weight: 255 lb 1.2 oz (115.7 kg) IBW/kg (Calculated) : 52.4  Vital Signs: Temp: 97.4 F (36.3 C) (05/11 0427) Temp Source: Oral (05/11 0427) BP: 89/50 mmHg (05/11 0600) Pulse Rate: 69 (05/11 0600) Intake/Output from previous day: 05/10 0701 - 05/11 0700 In: 2282.1 [I.V.:907.1; NG/GT:1225; IV Piggyback:150] Out: 725 [Urine:725]  Labs:  Recent Labs  06/17/14 0440 06/17/14 1900 06/18/14 0433 06/19/14 0510  WBC 11.0*  --  8.6 6.7  HGB 17.6*  --  16.2* 16.1*  PLT 118*  --  106* 90*  CREATININE 2.48* 2.80* 2.91* 3.22*   Estimated Creatinine Clearance: 22.5 mL/min (by C-G formula based on Cr of 3.22).  Recent Labs  06/16/14 1812  VANCORANDOM 25     Microbiology: Recent Results (from the past 720 hour(s))  Culture, blood (routine x 2)     Status: None (Preliminary result)   Collection Time: 06/11/2014  7:20 AM  Result Value Ref Range Status   Specimen Description BLOOD LEFT HAND  Final   Special Requests BOTTLES DRAWN AEROBIC AND ANAEROBIC 5CCS  Final   Culture   Final           BLOOD CULTURE RECEIVED NO GROWTH TO DATE CULTURE WILL BE HELD FOR 5 DAYS BEFORE ISSUING A FINAL NEGATIVE REPORT Performed at [facility]    Report Status PENDING  Incomplete  Culture, blood (routine x 2)     Status: None   Collection Time: 06/13/2014  7:28 AM  Result Value Ref Range Status   Specimen Description BLOOD RIGHT HAND  Final   Special Requests BOTTLES DRAWN AEROBIC ONLY 2CCS  Final   Culture   Final    STAPHYLOCOCCUS SPECIES (COAGULASE NEGATIVE) Note: THE SIGNIFICANCE OF ISOLATING THIS ORGANISM FROM A SINGLE SET OF BLOOD CULTURES WHEN MULTIPLE SETS ARE DRAWN IS UNCERTAIN. PLEASE NOTIFY THE  MICROBIOLOGY DEPARTMENT WITHIN ONE WEEK IF SPECIATION AND SENSITIVITIES ARE REQUIRED. Note: Gram Stain Report Called to,Read Back By and Verified With: JOY HOLLAND 06/16/14 0830 BY SMITHERSJ Performed at [facility]    Report Status 06/17/2014 FINAL  Final  MRSA PCR Screening     Status: Abnormal   Collection Time: 06/09/2014 10:46 AM  Result Value Ref Range Status   MRSA by PCR POSITIVE (A) NEGATIVE Final    Comment:        The GeneXpert MRSA Assay (FDA approved for NASAL specimens only), is one component of a comprehensive MRSA colonization surveillance program. It is not intended to diagnose MRSA infection nor to guide or monitor treatment for MRSA infections. RESULT CALLED TO, READ BACK BY AND VERIFIED WITH: J.BEABRAUT RN [id number] @ 0630 VESTALM   Culture, respiratory (NON-Expectorated)     Status: None   Collection Time: 06/21/2014 11:25 AM  Result Value Ref Range Status   Specimen Description TRACHEAL ASPIRATE  Final   Special Requests NONE  Final   Gram Stain   Final    RARE WBC PRESENT, PREDOMINANTLY MONONUCLEAR RARE SQUAMOUS EPITHELIAL CELLS PRESENT NO ORGANISMS SEEN Performed at [facility]    Culture   Final    Non-Pathogenic Oropharyngeal-type Flora Isolated. Performed at [facility]    Report Status 06/16/2014 FINAL  Final  Urine culture     Status: None   Collection Time: 06/29/2014  2:25 PM  Result Value Ref Range Status   Specimen Description URINE, RANDOM  Final   Special Requests Normal  Final   Colony Count NO GROWTH Performed at [facility]   Final   Culture NO GROWTH Performed at [facility]   Final   Report Status 06/15/2014 FINAL  Final      Assessment: 36 YOF continues on Zosyn (D#6) for sepsis and PNA.  Patient's renal function continues to decline but calculated normalized CrCL remains sufficient for extended-interval dosing.  Her urine output is improving.  ID: Zosyn D#6 for sepsis + PNA,  afebrile, WBC normalized  Vanc 5/6 >> 5/9 Zosyn 5/6 >>  5/7 1940 VT = 30 mcg/ml on 1gm IV q12h  5/8  1815 VR = 25 mcg/ml (Ke = 0.008, t1/2 = 86 hrs)  5/6 BCx2 - CoNS (1 of 2) 5/6 UCx - neg 5/6 Trach asp - neg MRSA PCR - positive   Goal of Therapy:  Resolution of infection   Plan:  - Continue Zosyn 3.375gm IV Q8H, 4 hr infusion - Monitor renal function, clinical progress - F/U abx LOT, pain control, plts and the need to hold heparin SQ    Monzerrat Wellen D. Mina Marble, PharmD, BCPS Pager:  [phone] 06/19/2014, 9:49 AM

## 2014-06-19 NOTE — Progress Notes (Signed)
Subjective: No acute events  Exam: Filed Vitals:   06/19/14 0740  BP:   Pulse: 70  Temp:   Resp: 19    HEENT- Normocephalic, no lesions, without obvious abnormality. Normal external eye and conjunctiva. Normal TM's bilaterally. Normal auditory canals and external ears. Normal external nose, mucus membranes and septum. Normal pharynx. Cardiovascular- S1, S2 normal, pulses palpable throughout  Lungs- no tachypnea, retractions or cyanosis Abdomen- normal findings: bowel sounds normal Extremities- bilateral LE and UE edema and discoloration.  Lymph-no adenopathy palpable Musculoskeletal-positive tenderness when moving arms and legs.  Skin-warm and dry, no hyperpigmentation, vitiligo, or suspicious lesions and bilatearl LE dicoloeration and warm to touch.     Gen: In bed, intubated MS: Intubated on prn Fentanyl and Versed. Opens eyes to voice and looks toward voice. Follows commands to wiggle toes.  SU:PJSR symmetrical, blinks to threat, Pupils 30mm bilaterally reactive, oculocephalic reflex intact, grimaces symmetrically to pain Motor: withdraws all 4 ext Sensory: grimaces from pain in all extremities DTR: depressed throughout   Impression: 64 yo F with encephalopathy in the setting of multiple metabolic derangements. She has had some improvement since admissino, but continues to be encephalopathic. I discussed the possibility of a risperdal overdose with her brother, but he is not sure if she did or not.   Recommendations: 1) Continue supportive care.  2) Please call if the patient does not improve as expected as her metabolic derangements improve.     Roland Rack, MD [facility] [phone]  If 7pm- 7am, please page neurology on call as listed in [place]. 06/19/2014, 8:15 AM

## 2014-06-19 NOTE — Progress Notes (Signed)
PULMONARY / CRITICAL CARE MEDICINE   Name: KONNER WARRIOR MRN: [id number] DOB: 10-Jun-1950    ADMISSION DATE:  07/08/2014  REFERRING MD :  [name]  CHIEF COMPLAINT:  Unresponsive  INITIAL PRESENTATION: 64 year old with extensive psych history adm via ED with AMS. She was not protecting her airway and [name] intubated patient. PCCM was called to admit. Patient was hypothermic on presentation. Multiple metabolic derangements  STUDIES:  5/06 TTE: EF 30 to 62%, grade 1 diastolic dysfx, PAS 43 mmHg 5/06 EEG: moderate diffuse slowing 5/06 Abd [place]: coarse liver ?cirrhosis, small amount of ascites 5/06 CT head: No CT evidence of acute intracranial abnormality 5/07 MRI brain: no acute findings, remote L MCA CVA  SIGNIFICANT EVENTS: 5/06 unresponsive, to ED and intubated, neuro consulted. Hyponatremia, polycythemia, suspected PNA 5/07 3% NS initiated 5/08 3% NS discontinued 5/09 Furosemide, milrinone infusions initiated. Discussion with her half brother (who has been her principle care provider) re: goals of care. No CPR/ACLS. No HD. Reassess progress in a couple of days and consider discontinuation of vent if no significant improvement 5/10 Precedex initiated for agitation 5/11 Remained encephalopathic. Not F/C. Cr continued to worsen  SUBJECTIVE:  RASS -2. Tolerates PS 10 cm H2O  VITAL SIGNS: Temp:  [97.4 F (36.3 C)-98.9 F (37.2 C)] 98.9 F (37.2 C) (05/11 1201) Pulse Rate:  [15-112] 70 (05/11 1100) Resp:  [7-24] 18 (05/11 1100) BP: (86-120)/(42-59) 105/54 mmHg (05/11 1100) SpO2:  [89 %-98 %] 96 % (05/11 1100) Arterial Line BP: (90-163)/(41-72) 126/48 mmHg (05/11 1100) FiO2 (%):  [40 %] 40 % (05/11 1156) Weight:  [115.7 kg (255 lb 1.2 oz)] 115.7 kg (255 lb 1.2 oz) (05/11 0417) HEMODYNAMICS:   VENTILATOR SETTINGS: Vent Mode:  [-] PRVC FiO2 (%):  [40 %] 40 % Set Rate:  [18 bmp] 18 bmp Vt Set:  [470 mL] 470 mL PEEP:  [5 cmH20] 5 cmH20 Pressure Support:  [10 cmH20] 10 cmH20 Plateau  Pressure:  [15 cmH20-25 cmH20] 15 cmH20 INTAKE / OUTPUT:  Intake/Output Summary (Last 24 hours) at 06/19/14 1216 Last data filed at 06/19/14 1100  Gross per 24 hour  Intake 2259.11 ml  Output    610 ml  Net 1649.11 ml    PHYSICAL EXAMINATION: General: Obese, RASS -2, not F/C Neuro: MAEs HEENT:  [place]/AT Cardiovascular:  Reg, no M Lungs:  Diffuse crackles in all lung fields. Abdomen:  Soft, NT, ND and +BS. Ext: Severe erythema diffusely, bilateral lower ext edema and venous stasis but pulses are weak and present.  LABS:  CBC  Recent Labs Lab 06/17/14 0440 06/18/14 0433 06/19/14 0510  WBC 11.0* 8.6 6.7  HGB 17.6* 16.2* 16.1*  HCT 51.0* 47.5* 46.2*  PLT 118* 106* 90*   Coag's  Recent Labs Lab 06/15/2014 0728  INR 1.42   BMET  Recent Labs Lab 06/17/14 1900 06/18/14 0433 06/19/14 0510  NA 123* 122* 126*  K 4.4 4.2 4.3  CL 91* 90* 91*  CO2 23 23 24   BUN 53* 56* 65*  CREATININE 2.80* 2.91* 3.22*  GLUCOSE 105* 122* 90   Electrolytes  Recent Labs Lab 06/15/14 0500  06/16/14 0316  06/17/14 1900 06/18/14 0433 06/19/14 0510  CALCIUM 7.8*  < > 8.0*  < > 8.1* 7.9* 8.2*  MG 1.8  --  1.9  --   --   --   --   PHOS 5.6*  --  5.4*  --   --   --   --   < > =  values in this interval not displayed.   Sepsis Markers  Recent Labs Lab 06/19/2014 0747  06/15/14 0500 06/15/14 2115 06/16/14 0316 06/19/14 0510  LATICACIDVEN 3.51*  --   --  1.5  --   --   PROCALCITON  --   < > 1.93  --  1.94 0.86  < > = values in this interval not displayed. ABG  Recent Labs Lab 07/09/2014 1008 06/15/14 0255 06/16/14 0500  PHART 7.219* 7.299* 7.302*  PCO2ART 55.8* 47.6* 44.7  PO2ART 131.0* 93.4 97.1   Liver Enzymes  Recent Labs Lab 07/07/2014 0728 06/16/14 0316  AST 73* 57*  ALT 36 31  ALKPHOS 200* 109  BILITOT 3.1* 2.2*  ALBUMIN 2.8* 2.0*   Cardiac Enzymes  Recent Labs Lab 06/09/2014 0830 07/01/2014 1400 06/09/2014 2034  TROPONINI 0.08* 0.09* 0.08*    Glucose  Recent Labs Lab 06/18/14 1542 06/18/14 1918 06/18/14 2335 06/19/14 0349 06/19/14 0821 06/19/14 1158  GLUCAP 135* 130* 109* 107* 112* 115*   CXR: [place] CM, edema   ASSESSMENT / PLAN:  PULMONARY ETT 5/06 >>  A: Acute (?on chronic) resp failure Pulm edema P:   Cont vent support - settings reviewed and/or adjusted PSV as tolerated Cont vent bundle Daily SBT if/when meets criteria No extubation until cognition improves or until DNI established  CARDIOVASCULAR L IJ TLC 5/6 >>  A:  Shock, suspect cardiogenic Severe cardiomyopathy Acute systolic heart failure A-fib with associated hypotension that [name] cardioverted P:  Cont pressors to keep MAP > 65 Cont Milrinone gtt 5/09 -   RENAL A:   Hyponatremia, multifactorial AKI, nonoliguric Severe hypervolemia Not a candidate for HD based on very poor functional status at baseline P:   Monitor BMET intermittently Monitor I/Os Correct electrolytes as indicated Furosemide gtt 5/09 - 5/10  GASTROINTESTINAL A:   Obesity High GRVs P:   SUP: enteral famotidine Cont TFs Consider motility agent if GRVs remain high  HEMATOLOGIC A:   Polycythemia, suspect chronic hypoxia P:  DVT px: SQ heparin Monitor CBC intermittently Transfuse per usual ICU guidelines  INFECTIOUS A:   Mildly elevated PCT of unclear etiology "+" BC is contaminant Possible PNA P:   MRSA PCR 5/06 >> POS Urine 5/06 >> NEG Resp 5/06 >> NOF Blood 5/06 >> 1/2 coag neg staph (contaminant)  Vanc 5/06 >> 5/08 Pip-tazo 5/06 >> 5/11   ENDOCRINE A:   Hyperglycemia without prior DM history Hypothyroidism >> TSH 5.063 from 5/07. P:   Cont SSI Cont L thyroxine - increased 5/10  NEUROLOGIC A:   Acute encephalopathy - multifactorial Hx of bipolar disorder P:   RASS goal: 0 to-1 Cont dex  CC time 35 minutes  Merton Border, MD ; [facility] [phone].  After 5:30 PM or weekends, call [phone]

## 2014-06-19 NOTE — Consult Note (Signed)
WOC wound follow-up consult note Buttocks with dark purple deep tissue injury has greatly improved since previous assessment and is almost resolved; 1X1cm remaining.  Bilat buttocks are red and macerated with moisture associated skin damage. Inner groin and perineum remain unchanged since last week;  dark red, macerated, with generalized edema and mod amt yellow drainage, no odor. Appearance consistent with intertrigo.  Right abd with dark purple deep tissue injury in a linear shape which has evolved into partial thickness skin loss, red and weeping mod amt pink drainage, approx 4X2X.1cm Left anterior foot with full thickness wound has not imporved, 1X1X.2cm, 100% yellow moist wound bed with small amt yellow drainage. Bilat legs dark purple from upper calves to feet, cold to touch. Topical treatment will not be effective, please refer to [facility] for further plan of care if desired. Plan: Pt is on a Sport low-airloss bed to reduce pressure.Continue present plan of care with Interdry silver-impregnated fabric to inner groin and lower abd folds to wick drainage away from skin and provide antimicrobial benefits. Bactroban to promote moist healing to left foot wound. Eucerin to treat dry peeling skin. Foam dressings to buttocks, right elbow, and other draining areas. Pt is intubated and not very responsive, no family present to discuss plan of care.Pt has multiple systemic factors which can impair healing. Please re-consult if further assistance is needed.  Thank-you,  Julien Girt MSN, [place], [place], [place], [place]

## 2014-06-19 NOTE — Progress Notes (Signed)
[place] Progress Note Patient Name: Ana Hampton DOB: 1950/05/20 MRN: [id number]   Date of Service  06/19/2014  HPI/Events of Note  Enteral nutrition residuals > 500 mL X 3.   eICU Interventions  Will order: 1. Hold enteral nutrition. 2. IV fluid - 0.9 NaCl to run IV at 125 mL/hour.      Intervention Category Minor Interventions: Routine modifications to care plan (e.g. PRN medications for pain, fever)  Rainah Kirshner [name] 06/19/2014, 4:56 PM

## 2014-06-20 ENCOUNTER — Inpatient Hospital Stay (HOSPITAL_COMMUNITY): Payer: Medicare Other

## 2014-06-20 DIAGNOSIS — R401 Stupor: Secondary | ICD-10-CM

## 2014-06-20 LAB — COMPREHENSIVE METABOLIC PANEL
ALT: 27 U/L (ref 14–54)
AST: 32 U/L (ref 15–41)
Albumin: 2 g/dL — ABNORMAL LOW (ref 3.5–5.0)
Alkaline Phosphatase: 82 U/L (ref 38–126)
Anion gap: 11 (ref 5–15)
BUN: 70 mg/dL — AB (ref 6–20)
CALCIUM: 8.4 mg/dL — AB (ref 8.9–10.3)
CO2: 23 mmol/L (ref 22–32)
CREATININE: 3.64 mg/dL — AB (ref 0.44–1.00)
Chloride: 95 mmol/L — ABNORMAL LOW (ref 101–111)
GFR calc Af Amer: 14 mL/min — ABNORMAL LOW (ref >60)
GFR, EST NON AFRICAN AMERICAN: 12 mL/min — AB (ref >60)
Glucose, Bld: 92 mg/dL (ref 65–99)
Potassium: 4.6 mmol/L (ref 3.5–5.1)
Sodium: 129 mmol/L — ABNORMAL LOW (ref 135–145)
Total Bilirubin: 2 mg/dL — ABNORMAL HIGH (ref 0.3–1.2)
Total Protein: 4.4 g/dL — ABNORMAL LOW (ref 6.5–8.1)

## 2014-06-20 LAB — CULTURE, BLOOD (ROUTINE X 2): Culture: NO GROWTH

## 2014-06-20 LAB — GLUCOSE, CAPILLARY
GLUCOSE-CAPILLARY: 98 mg/dL (ref 65–99)
Glucose-Capillary: 103 mg/dL — ABNORMAL HIGH (ref 65–99)
Glucose-Capillary: 83 mg/dL (ref 65–99)
Glucose-Capillary: 97 mg/dL (ref 65–99)
Glucose-Capillary: 98 mg/dL (ref 65–99)
Glucose-Capillary: 98 mg/dL (ref 65–99)

## 2014-06-20 LAB — CBC
HEMATOCRIT: 44.3 % (ref 36.0–46.0)
Hemoglobin: 15.2 g/dL — ABNORMAL HIGH (ref 12.0–15.0)
MCH: 28.5 pg (ref 26.0–34.0)
MCHC: 34.3 g/dL (ref 30.0–36.0)
MCV: 83 fL (ref 78.0–100.0)
PLATELETS: 98 10*3/uL — AB (ref 150–400)
RBC: 5.34 MIL/uL — ABNORMAL HIGH (ref 3.87–5.11)
RDW: 18.4 % — ABNORMAL HIGH (ref 11.5–15.5)
WBC: 7.9 10*3/uL (ref 4.0–10.5)

## 2014-06-20 MED ORDER — ERYTHROMYCIN ETHYLSUCCINATE 200 MG/5ML PO SUSR
400.0000 mg | Freq: Three times a day (TID) | ORAL | Status: AC
  Administered 2014-06-20 – 2014-06-24 (×12): 400 mg
  Filled 2014-06-20 (×13): qty 10

## 2014-06-20 MED ORDER — ERYTHROMYCIN ETHYLSUCCINATE 200 MG/5ML PO SUSR
400.0000 mg | Freq: Three times a day (TID) | ORAL | Status: DC
  Filled 2014-06-20 (×2): qty 10

## 2014-06-20 MED ORDER — POLYETHYLENE GLYCOL 3350 17 G PO PACK
17.0000 g | PACK | Freq: Once | ORAL | Status: AC
  Administered 2014-06-20: 17 g via ORAL
  Filled 2014-06-20: qty 1

## 2014-06-20 MED ORDER — DOCUSATE SODIUM 50 MG/5ML PO LIQD
100.0000 mg | Freq: Every day | ORAL | Status: DC
  Administered 2014-06-20 – 2014-06-24 (×5): 100 mg
  Filled 2014-06-20 (×7): qty 10

## 2014-06-20 MED ORDER — FENTANYL CITRATE (PF) 100 MCG/2ML IJ SOLN
12.5000 ug | INTRAMUSCULAR | Status: DC | PRN
  Administered 2014-06-20 – 2014-06-23 (×8): 25 ug via INTRAVENOUS
  Filled 2014-06-20 (×9): qty 2

## 2014-06-20 MED ORDER — BISACODYL 10 MG RE SUPP
10.0000 mg | Freq: Once | RECTAL | Status: AC
  Administered 2014-06-20: 10 mg via RECTAL
  Filled 2014-06-20: qty 1

## 2014-06-20 MED ORDER — ERYTHROMYCIN ETHYLSUCCINATE 400 MG/5ML PO SUSR
400.0000 mg | Freq: Three times a day (TID) | ORAL | Status: DC
Start: 2014-06-20 — End: 2014-06-20
  Filled 2014-06-20 (×3): qty 5

## 2014-06-20 MED ORDER — BISACODYL 10 MG RE SUPP: 10.0000 mg | Freq: Every day | RECTAL | Status: DC | PRN

## 2014-06-20 NOTE — Progress Notes (Signed)
Found pt on PSV vent weaning, MD made vent change.  Pt appears to be tol well so far.

## 2014-06-20 NOTE — Progress Notes (Signed)
PULMONARY / CRITICAL CARE MEDICINE   Name: Ana Hampton MRN: [id number] DOB: 12-Aug-1950    ADMISSION DATE:  07/01/2014  REFERRING MD :  [name]  CHIEF COMPLAINT:  Unresponsive  INITIAL PRESENTATION: 64 year old with extensive psych history adm via ED with AMS. She was not protecting her airway and [name] intubated patient. PCCM was called to admit. Patient was hypothermic on presentation. Multiple metabolic derangements  STUDIES:  5/06 TTE: EF 30 to 38%, grade 1 diastolic dysfx, PAS 43 mmHg 5/06 EEG: moderate diffuse slowing 5/06 Abd [place]: coarse liver ?cirrhosis, small amount of ascites 5/06 CT head: No CT evidence of acute intracranial abnormality 5/07 MRI brain: no acute findings, remote L MCA CVA  SIGNIFICANT EVENTS: 5/06 unresponsive, to ED and intubated, neuro consulted. Hyponatremia, polycythemia, suspected PNA 5/07 3% NS initiated 5/08 3% NS discontinued 5/09 Furosemide, milrinone infusions initiated. Discussion with her half brother (who has been her principle care provider) re: goals of care. No CPR/ACLS. No HD. Reassess progress in a couple of days and consider discontinuation of vent if no significant improvement 5/10 Precedex initiated for agitation 5/11 Remained encephalopathic. Not F/C. Cr continued to worsen 5/12 High GRVs - EES initiated. Constipation - bowel regimen initiated. Agitated in WUA but not F/C. Did not tolerate PSV  SUBJECTIVE:  RASS -2. Not F/C  VITAL SIGNS: Temp:  [94.6 F (34.8 C)-100.1 F (37.8 C)] 99.6 F (37.6 C) (05/12 1300) Pulse Rate:  [61-108] 84 (05/12 1500) Resp:  [11-28] 18 (05/12 1500) BP: (76-122)/(30-71) 76/59 mmHg (05/12 1500) SpO2:  [93 %-99 %] 95 % (05/12 1500) Arterial Line BP: (140)/(61) 140/61 mmHg (05/11 1600) FiO2 (%):  [40 %] 40 % (05/12 1500) Weight:  [118.9 kg (262 lb 2 oz)] 118.9 kg (262 lb 2 oz) (05/12 0335) HEMODYNAMICS:   VENTILATOR SETTINGS: Vent Mode:  [-] PRVC FiO2 (%):  [40 %] 40 % Set Rate:  [18 bmp] 18 bmp Vt  Set:  [470 mL] 470 mL PEEP:  [5 cmH20] 5 cmH20 Pressure Support:  [14 cmH20] 14 cmH20 Plateau Pressure:  [22 cmH20-24 cmH20] 24 cmH20 INTAKE / OUTPUT:  Intake/Output Summary (Last 24 hours) at 06/20/14 1515 Last data filed at 06/20/14 1200  Gross per 24 hour  Intake 3347.5 ml  Output    665 ml  Net 2682.5 ml    PHYSICAL EXAMINATION: General: Obese, RASS -2, not F/C Neuro: MAEs HEENT:  [place]/AT Cardiovascular:  Reg, no M Lungs:  Diffuse crackles in all lung fields. Abdomen:  Soft, NT, ND and +BS. Ext: Severe erythema diffusely, bilateral lower ext edema and venous stasis but pulses are weak and present.  LABS:  CBC  Recent Labs Lab 06/18/14 0433 06/19/14 0510 06/20/14 0420  WBC 8.6 6.7 7.9  HGB 16.2* 16.1* 15.2*  HCT 47.5* 46.2* 44.3  PLT 106* 90* 98*   Coag's  Recent Labs Lab 06/16/2014 0728  INR 1.42   BMET  Recent Labs Lab 06/18/14 0433 06/19/14 0510 06/20/14 0420  NA 122* 126* 129*  K 4.2 4.3 4.6  CL 90* 91* 95*  CO2 23 24 23   BUN 56* 65* 70*  CREATININE 2.91* 3.22* 3.64*  GLUCOSE 122* 90 92   Electrolytes  Recent Labs Lab 06/15/14 0500  06/16/14 0316  06/18/14 0433 06/19/14 0510 06/20/14 0420  CALCIUM 7.8*  < > 8.0*  < > 7.9* 8.2* 8.4*  MG 1.8  --  1.9  --   --   --   --   PHOS 5.6*  --  5.4*  --   --   --   --   < > = values in this interval not displayed.   Sepsis Markers  Recent Labs Lab 07/05/2014 0747  06/15/14 0500 06/15/14 2115 06/16/14 0316 06/19/14 0510  LATICACIDVEN 3.51*  --   --  1.5  --   --   PROCALCITON  --   < > 1.93  --  1.94 0.86  < > = values in this interval not displayed. ABG  Recent Labs Lab 06/25/2014 1008 06/15/14 0255 06/16/14 0500  PHART 7.219* 7.299* 7.302*  PCO2ART 55.8* 47.6* 44.7  PO2ART 131.0* 93.4 97.1   Liver Enzymes  Recent Labs Lab 06/17/2014 0728 06/16/14 0316 06/20/14 0420  AST 73* 57* 32  ALT 36 31 27  ALKPHOS 200* 109 82  BILITOT 3.1* 2.2* 2.0*  ALBUMIN 2.8* 2.0* 2.0*   Cardiac  Enzymes  Recent Labs Lab 06/11/2014 0830 06/15/2014 1400 06/22/2014 2034  TROPONINI 0.08* 0.09* 0.08*   Glucose  Recent Labs Lab 06/19/14 1622 06/19/14 1928 06/20/14 0026 06/20/14 0431 06/20/14 0817 06/20/14 1129  GLUCAP 106* 105* 97 83 98 98   CXR: [place] CM, edema, R>L effusions   ASSESSMENT / PLAN:  PULMONARY ETT 5/06 >>  A: Acute (?on chronic) resp failure Severe pulm edema P:   Cont vent support - settings reviewed and/or adjusted PSV as tolerated Cont vent bundle Daily SBT if/when meets criteria No extubation until cognition improves or until DNI established  CARDIOVASCULAR L IJ TLC 5/6 >>  A:  Shock, resolved Severe cardiomyopathy Acute systolic heart failure A-fib with associated hypotension that [name] cardioverted P:  Cont Milrinone gtt 5/09 -   RENAL A:   Hyponatremia, multifactorial - improving AKI, borderline oliguric Severe hypervolemia Not a candidate for HD based on very poor functional status at baseline P:   Monitor BMET intermittently Monitor I/Os Correct electrolytes as indicated Furosemide gtt 5/09 - 5/10  GASTROINTESTINAL A:   Obesity High GRVs Constipation P:   SUP: enteral famotidine Cont TFs EES as motility agent 5/12 Bowel regimen 5/12  HEMATOLOGIC A:   Polycythemia, suspect chronic hypoxia Mild thrombocytopenia - suspect chronic P:  DVT px: SQ heparin Monitor CBC intermittently Transfuse per usual ICU guidelines  INFECTIOUS A:   Mildly elevated PCT of unclear etiology "+" BC is contaminant Possible PNA P:   MRSA PCR 5/06 >> POS Urine 5/06 >> NEG Resp 5/06 >> NOF Blood 5/06 >> 1/2 coag neg staph (contaminant)  Vanc 5/06 >> 5/08 Pip-tazo 5/06 >> 5/11  ENDOCRINE A:   Hyperglycemia without prior DM history Hypothyroidism >> TSH 5.063 from 5/07. 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  After 5:30 PM or weekends, call [phone]

## 2014-06-20 NOTE — Progress Notes (Signed)
Utilization review completed.  

## 2014-06-21 ENCOUNTER — Inpatient Hospital Stay (HOSPITAL_COMMUNITY): Payer: Medicare Other

## 2014-06-21 DIAGNOSIS — J810 Acute pulmonary edema: Secondary | ICD-10-CM

## 2014-06-21 LAB — BASIC METABOLIC PANEL
Anion gap: 11 (ref 5–15)
BUN: 71 mg/dL — ABNORMAL HIGH (ref 6–20)
CHLORIDE: 95 mmol/L — AB (ref 101–111)
CO2: 23 mmol/L (ref 22–32)
Calcium: 8.5 mg/dL — ABNORMAL LOW (ref 8.9–10.3)
Creatinine, Ser: 3.74 mg/dL — ABNORMAL HIGH (ref 0.44–1.00)
GFR calc non Af Amer: 12 mL/min — ABNORMAL LOW (ref >60)
GFR, EST AFRICAN AMERICAN: 14 mL/min — AB (ref >60)
GLUCOSE: 116 mg/dL — AB (ref 65–99)
Potassium: 4.2 mmol/L (ref 3.5–5.1)
Sodium: 129 mmol/L — ABNORMAL LOW (ref 135–145)

## 2014-06-21 LAB — GLUCOSE, CAPILLARY
GLUCOSE-CAPILLARY: 127 mg/dL — AB (ref 65–99)
GLUCOSE-CAPILLARY: 128 mg/dL — AB (ref 65–99)
GLUCOSE-CAPILLARY: 139 mg/dL — AB (ref 65–99)
Glucose-Capillary: 114 mg/dL — ABNORMAL HIGH (ref 65–99)
Glucose-Capillary: 136 mg/dL — ABNORMAL HIGH (ref 65–99)
Glucose-Capillary: 144 mg/dL — ABNORMAL HIGH (ref 65–99)

## 2014-06-21 NOTE — Progress Notes (Signed)
Nutrition Follow-up  DOCUMENTATION CODES:  Obesity unspecified  INTERVENTION:  Tube feeding; Continue Vital High Protein via OGT, increase to goal rate of 50 ml/h (1200 ml/day) to provide 1200 kcals, 105 gm protein, 1003 ml free water daily.   NUTRITION DIAGNOSIS:  Inadequate oral intake related to inability to eat as evidenced by NPO status.  Ongoing.  GOAL:  Provide needs based on ASPEN/SCCM guidelines  Unmet.  MONITOR:  Vent status, Labs, Weight trends, TF tolerance   ASSESSMENT:  Patient admitted 5/6 after being found unresponsive by family. Intubated in the ED.  Patient is currently receiving Vital High Protein via OGT at 35 ml/h (840 ml/day) goal is 50 ml/h (1200 ml per day) to provide 1200 kcals, 105 gm protein, 1003 ml free water daily.   Discussed patient in ICU rounds and with RN today. Small residuals, tolerating TF well, not at previous goal rate, unsure why.  Labs reviewed. Sodium low, BUN & creat elevated.  Height:  Ht Readings from Last 1 Encounters:  07/08/2014 5\' 3"  (1.6 m)    Weight:  Wt Readings from Last 1 Encounters:  06/21/14 258 lb 2.5 oz (117.1 kg)   06/13/2014 190 lb (86.183 kg)       Ideal Body Weight:  52.3 kg   BMI:  Body mass index is 45.74 kg/(m^2).  Estimated Nutritional Needs:  Kcal:  166-0630  Protein:  105 gm  Fluid:  >/= 1.5 L  Skin:  Wound (see comment) (DTI to buttocks)  Diet Order:   NPO  EDUCATION NEEDS:  No education needs identified at this time   Intake/Output Summary (Last 24 hours) at 06/21/14 1201 Last data filed at 06/21/14 1000  Gross per 24 hour  Intake  564.6 ml  Output    945 ml  Net -380.4 ml    Last BM:  5/9   Molli Barrows, RD, LDN, [place] Pager [phone] After Hours Pager [phone]

## 2014-06-21 NOTE — Progress Notes (Signed)
PULMONARY / CRITICAL CARE MEDICINE   Name: Ana Hampton MRN: [id number] DOB: 12-05-1950    ADMISSION DATE:  06/16/2014  REFERRING MD :  [name]  CHIEF COMPLAINT:  Unresponsive  INITIAL PRESENTATION: 64 year old with extensive psych history adm via ED with AMS. She was not protecting her airway and [name] intubated patient. PCCM was called to admit. Patient was hypothermic on presentation. Multiple metabolic derangements  STUDIES:  5/06 TTE: EF 30 to 85%, grade 1 diastolic dysfx, PAS 43 mmHg 5/06 EEG: moderate diffuse slowing 5/06 Abd [place]: coarse liver ?cirrhosis, small amount of ascites 5/06 CT head: No CT evidence of acute intracranial abnormality 5/07 MRI brain: no acute findings, remote L MCA CVA  SIGNIFICANT EVENTS: 5/06 unresponsive, to ED and intubated, neuro consulted. Hyponatremia, polycythemia, suspected PNA 5/07 3% NS initiated 5/08 3% NS discontinued 5/09 Furosemide, milrinone infusions initiated. Discussion with her half brother (who has been her principle care provider) re: goals of care. No CPR/ACLS. No HD. Reassess progress in a couple of days and consider discontinuation of vent if no significant improvement 5/10 Precedex initiated for agitation 5/11 Remained encephalopathic. Not F/C. Cr continued to worsen 5/12 High GRVs - EES initiated. Constipation - bowel regimen initiated. Agitated in WUA but not F/C. Did not tolerate PSV 5/13 F/C intermittently. Tolerated SBT. Persistent changes of pulm edema/effusions. Family members failed to show for planned conference to discuss goals and options of care  SUBJECTIVE:  RASS -2. Not F/C for me. Reportedly F/C for RN this AM  VITAL SIGNS: Temp:  [96.8 F (36 C)-98.7 F (37.1 C)] 98 F (36.7 C) (05/13 1113) Pulse Rate:  [46-98] 84 (05/13 1300) Resp:  [0-24] 14 (05/13 1300) BP: (95-126)/(45-76) 101/61 mmHg (05/13 1300) SpO2:  [93 %-98 %] 98 % (05/13 1538) FiO2 (%):  [40 %-50 %] 40 % (05/13 1538) Weight:  [117.1 kg (258 lb 2.5  oz)] 117.1 kg (258 lb 2.5 oz) (05/13 0530) HEMODYNAMICS:   VENTILATOR SETTINGS: Vent Mode:  [-] CPAP;PSV FiO2 (%):  [40 %-50 %] 40 % Set Rate:  [18 bmp] 18 bmp Vt Set:  [470 mL] 470 mL PEEP:  [5 cmH20] 5 cmH20 Pressure Support:  [5 cmH20] 5 cmH20 Plateau Pressure:  [14 cmH20-28 cmH20] 14 cmH20 INTAKE / OUTPUT:  Intake/Output Summary (Last 24 hours) at 06/21/14 1550 Last data filed at 06/21/14 1345  Gross per 24 hour  Intake  904.3 ml  Output   1125 ml  Net -220.7 ml    PHYSICAL EXAMINATION: General: Obese, RASS -2, not F/C Neuro: MAEs HEENT:  [place]/AT Cardiovascular:  Reg, no M Lungs:  Diffuse crackles Abdomen:  Soft, NT, ND and +BS. Ext: Severe erythema diffusely, anasarca  LABS:  CBC  Recent Labs Lab 06/18/14 0433 06/19/14 0510 06/20/14 0420  WBC 8.6 6.7 7.9  HGB 16.2* 16.1* 15.2*  HCT 47.5* 46.2* 44.3  PLT 106* 90* 98*   Coag's No results for input(s): APTT, INR in the last 168 hours. BMET  Recent Labs Lab 06/19/14 0510 06/20/14 0420 06/21/14 0446  NA 126* 129* 129*  K 4.3 4.6 4.2  CL 91* 95* 95*  CO2 24 23 23   BUN 65* 70* 71*  CREATININE 3.22* 3.64* 3.74*  GLUCOSE 90 92 116*   Electrolytes  Recent Labs Lab 06/15/14 0500  06/16/14 0316  06/19/14 0510 06/20/14 0420 06/21/14 0446  CALCIUM 7.8*  < > 8.0*  < > 8.2* 8.4* 8.5*  MG 1.8  --  1.9  --   --   --   --  PHOS 5.6*  --  5.4*  --   --   --   --   < > = values in this interval not displayed.   Sepsis Markers  Recent Labs Lab 06/15/14 0500 06/15/14 2115 06/16/14 0316 06/19/14 0510  LATICACIDVEN  --  1.5  --   --   PROCALCITON 1.93  --  1.94 0.86   ABG  Recent Labs Lab 06/15/14 0255 06/16/14 0500  PHART 7.299* 7.302*  PCO2ART 47.6* 44.7  PO2ART 93.4 97.1   Liver Enzymes  Recent Labs Lab 06/16/14 0316 06/20/14 0420  AST 57* 32  ALT 31 27  ALKPHOS 109 82  BILITOT 2.2* 2.0*  ALBUMIN 2.0* 2.0*   Cardiac Enzymes  Recent Labs Lab 07/06/2014 2034  TROPONINI 0.08*    Glucose  Recent Labs Lab 06/20/14 1612 06/20/14 2017 06/20/14 2333 06/21/14 0413 06/21/14 0827 06/21/14 1109  GLUCAP 103* 98 128* 114* 136* 139*   CXR: [place] CM, edema, effusions   ASSESSMENT / PLAN:  PULMONARY ETT 5/06 >>  A: Acute (?on chronic) resp failure Severe pulm edema P:   Cont vent support - settings reviewed and/or adjusted PSV as tolerated Cont vent bundle Daily SBT if/when meets criteria No extubation until cognition improves or until DNI established  CARDIOVASCULAR L IJ TLC 5/6 >>  A:  Shock, resolved Severe cardiomyopathy Acute systolic heart failure A-fib with associated hypotension that [name] cardioverted Ectopy 5/14 P:  Milrinone gtt 5/09 - 5/13 Cont to monitor DNR - not a candidate for ACLS/CPR  RENAL A:   Hyponatremia, multifactorial - improving AKI, nonoliguric Anasarca Not a candidate for HD based on very poor functional status at baseline P:   Monitor BMET intermittently Monitor I/Os Correct electrolytes as indicated Furosemide gtt 5/09 - 5/10 Consider further diuresis if/when Cr improves  GASTROINTESTINAL A:   Obesity High GRVs - improved Constipation P:   SUP: enteral famotidine Cont TFs EES as motility agent initiated 5/12 Bowel regimen initiated 5/12  HEMATOLOGIC A:   Polycythemia, suspect chronic hypoxia Mild thrombocytopenia - suspect chronic P:  DVT px: SQ heparin Monitor CBC intermittently Transfuse per usual ICU guidelines  INFECTIOUS A:   Mildly elevated PCT of unclear etiology "+" BC on admission - contaminant Possible PNA, treated P:   MRSA PCR 5/06 >> POS Urine 5/06 >> NEG Resp 5/06 >> NOF Blood 5/06 >> 1/2 coag neg staph (contaminant)  Vanc 5/06 >> 5/08 Pip-tazo 5/06 >> 5/11  ENDOCRINE A:   Hyperglycemia without prior DM history Hypothyroidism >> TSH 5.063 from 5/07. 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 After prior discussion with pt's half-brother, limitations were established including no ACLS, no HD. Since her MS has apparently improved (F/C this AM for RN) and since her Uo has improved, it is reasonable to continue through this WE with these limitations. She is an extremely poor candidate for trach and long term vent. Would suggest one way extubation when we feel that we have gotten maximum benefit from our efforts   Merton Border, MD ; [facility] [phone].  After 5:30 PM or weekends, call [phone]

## 2014-06-21 NOTE — Progress Notes (Signed)
Increased foi2 to 50% during weaning on SBT d/t pt sat dropped to 91% on 40%. Pt tol wean well so far, no distress noted.

## 2014-06-22 LAB — GLUCOSE, CAPILLARY
GLUCOSE-CAPILLARY: 128 mg/dL — AB (ref 65–99)
GLUCOSE-CAPILLARY: 128 mg/dL — AB (ref 65–99)
GLUCOSE-CAPILLARY: 136 mg/dL — AB (ref 65–99)
Glucose-Capillary: 131 mg/dL — ABNORMAL HIGH (ref 65–99)
Glucose-Capillary: 117 mg/dL — ABNORMAL HIGH (ref 65–99)
Glucose-Capillary: 119 mg/dL — ABNORMAL HIGH (ref 65–99)

## 2014-06-22 NOTE — Progress Notes (Addendum)
PULMONARY / CRITICAL CARE MEDICINE   Name: Ana Hampton MRN: [id number] DOB: [birth year]    ADMISSION DATE:  06/29/2014  REFERRING MD :  [name]  CHIEF COMPLAINT:  Unresponsive  INITIAL PRESENTATION: 64 year old with extensive psych history adm via ED with AMS. She was not protecting her airway and [name] intubated patient. PCCM was called to admit. Patient was hypothermic on presentation. Multiple metabolic derangements  STUDIES:  5/06 TTE: EF 30 to 67%, grade 1 diastolic dysfx, PAS 43 mmHg 5/06 EEG: moderate diffuse slowing 5/06 Abd [place]: coarse liver ?cirrhosis, small amount of ascites 5/06 CT head: No CT evidence of acute intracranial abnormality 5/07 MRI brain: no acute findings, remote L MCA CVA  SIGNIFICANT EVENTS: 5/06 unresponsive, to ED and intubated, neuro consulted. Hyponatremia, polycythemia, suspected PNA 5/07 3% NS initiated 5/08 3% NS discontinued 5/09 Furosemide, milrinone infusions initiated. Discussion with her half brother (who has been her principle care provider) re: goals of care. No CPR/ACLS. No HD. Reassess progress in a couple of days and consider discontinuation of vent if no significant improvement 5/10 Precedex initiated for agitation 5/11 Remained encephalopathic. Not F/C. Cr continued to worsen 5/12 High GRVs - EES initiated. Constipation - bowel regimen initiated. Agitated in WUA but not F/C. Did not tolerate PSV 5/13 F/C intermittently. Tolerated SBT. Persistent changes of pulm edema/effusions. Family members failed to show for planned conference to discuss goals and options of care   VITAL SIGNS: Temp:  [97.5 F (36.4 C)-98.6 F (37 C)] 98.6 F (37 C) (05/14 0739) Pulse Rate:  [53-98] 79 (05/14 0600) Resp:  [11-22] 21 (05/14 0600) BP: (95-146)/(50-66) 112/62 mmHg (05/14 0600) SpO2:  [90 %-98 %] 93 % (05/14 0600) FiO2 (%):  [40 %-50 %] 40 % (05/14 0600) Weight:  [114.3 kg (251 lb 15.8 oz)] 114.3 kg (251 lb 15.8 oz) (05/14 0417) HEMODYNAMICS:    VENTILATOR SETTINGS: Vent Mode:  [-] PRVC FiO2 (%):  [40 %-50 %] 40 % Set Rate:  [14 bmp] 14 bmp Vt Set:  [470 mL] 470 mL PEEP:  [5 cmH20] 5 cmH20 Pressure Support:  [5 cmH20] 5 cmH20 Plateau Pressure:  [18 cmH20] 18 cmH20 INTAKE / OUTPUT:  Intake/Output Summary (Last 24 hours) at 06/22/14 0750 Last data filed at 06/22/14 6720  Gross per 24 hour  Intake 2013.07 ml  Output   1730 ml  Net 283.07 ml    PHYSICAL EXAMINATION: General: Obese, RASS -1, generalized weakness, but follows commands Neuro: MAEs HEENT:  [place]/AT Cardiovascular:  Reg, no M Lungs:  Diffuse rhonchi  Abdomen:  Soft, NT, ND and +BS. Ext: Severe erythema diffusely, anasarca  LABS:  CBC  Recent Labs Lab 06/18/14 0433 06/19/14 0510 06/20/14 0420  WBC 8.6 6.7 7.9  HGB 16.2* 16.1* 15.2*  HCT 47.5* 46.2* 44.3  PLT 106* 90* 98*   Coag's No results for input(s): APTT, INR in the last 168 hours. BMET  Recent Labs Lab 06/19/14 0510 06/20/14 0420 06/21/14 0446  NA 126* 129* 129*  K 4.3 4.6 4.2  CL 91* 95* 95*  CO2 24 23 23   BUN 65* 70* 71*  CREATININE 3.22* 3.64* 3.74*  GLUCOSE 90 92 116*   Electrolytes  Recent Labs Lab 06/16/14 0316  06/19/14 0510 06/20/14 0420 06/21/14 0446  CALCIUM 8.0*  < > 8.2* 8.4* 8.5*  MG 1.9  --   --   --   --   PHOS 5.4*  --   --   --   --   < > =  values in this interval not displayed.   Sepsis Markers  Recent Labs Lab 06/15/14 2115 06/16/14 0316 06/19/14 0510  LATICACIDVEN 1.5  --   --   PROCALCITON  --  1.94 0.86   ABG  Recent Labs Lab 06/16/14 0500  PHART 7.302*  PCO2ART 44.7  PO2ART 97.1   Liver Enzymes  Recent Labs Lab 06/16/14 0316 06/20/14 0420  AST 57* 32  ALT 31 27  ALKPHOS 109 82  BILITOT 2.2* 2.0*  ALBUMIN 2.0* 2.0*   Cardiac Enzymes No results for input(s): TROPONINI, PROBNP in the last 168 hours. Glucose  Recent Labs Lab 06/21/14 0413 06/21/14 0827 06/21/14 1109 06/21/14 1601 06/21/14 2002 06/21/14 2347  GLUCAP  114* 136* 139* 144* 127* 136*   CXR: NSC CM, edema, effusions   ASSESSMENT / PLAN:  PULMONARY ETT 5/06 >>  A: Acute (?on chronic) resp failure (initial P/F ratio s/p intubation 186) Ineffective airway protection Severe pulm edema R>L effusion  P:   Cont vent support - settings reviewed and/or adjusted PSV as tolerated Cont vent bundle Will resume diuresis today  Daily SBT if/when meets criteria No extubation until cognition improves or until DNI established  CARDIOVASCULAR L IJ TLC 5/6 >>  A:  Shock, resolved Severe cardiomyopathy Acute systolic heart failure A-fib with associated hypotension that [name] cardioverted Ectopy 5/14 P:  Cont to monitor DNR - not a candidate for ACLS/CPR  RENAL A:   Hyponatremia, multifactorial - improving AKI, nonoliguric Anasarca Not a candidate for HD based on very poor functional status at baseline P:   Monitor BMET intermittently Monitor I/Os Correct electrolytes as indicated Resume lasix   GASTROINTESTINAL A:   Obesity Protein calorie malnutrition  High GRVs - improved Constipation P:   SUP: enteral famotidine Cont TFs EES as motility agent initiated 5/12 Bowel regimen initiated 5/12  HEMATOLOGIC A:   Polycythemia, suspect chronic hypoxia Mild thrombocytopenia - suspect chronic P:  DVT px: SQ heparin Monitor CBC intermittently Transfuse per usual ICU guidelines  INFECTIOUS A:   Mildly elevated PCT of unclear etiology "+" BC on admission - contaminant Possible PNA, treated P:   MRSA PCR 5/06 >> POS Urine 5/06 >> NEG Resp 5/06 >> NOF Blood 5/06 >> 1/2 coag neg staph (contaminant)  Vanc 5/06 >> 5/08 Pip-tazo 5/06 >> 5/11  ENDOCRINE A:   Hyperglycemia without prior DM history Hypothyroidism >> TSH 5.063 from 5/07. 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 Shock resolved. Renal fxn not better. MS remains barrier to extubation. Need to get volume off. 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 I have discussed the case with Jerrye Bushy, and I agree with the data and plans as amended above. Pt continues to have respiratory, renal, MS dysfunction likely due to decompensated CHF and cardiogenic shock slow improvement in metabolic status and MS. May be a candidate  for extubation in next couple days. On my eval she is not awake enough to consider extubation today.  I would advocate discussing goals of care with her half-brother prior to an extubation - she probably should not be reintubated if she fails. Independent critical care time is 40 minutes.   Baltazar Apo, MD, PhD 06/22/2014, 2:20 PM [facility] [phone] or if no answer [phone]

## 2014-06-23 ENCOUNTER — Inpatient Hospital Stay (HOSPITAL_COMMUNITY): Payer: Medicare Other

## 2014-06-23 LAB — COMPREHENSIVE METABOLIC PANEL
ALT: 29 U/L (ref 14–54)
AST: 31 U/L (ref 15–41)
Albumin: 1.8 g/dL — ABNORMAL LOW (ref 3.5–5.0)
Alkaline Phosphatase: 166 U/L — ABNORMAL HIGH (ref 38–126)
Anion gap: 11 (ref 5–15)
BUN: 83 mg/dL — AB (ref 6–20)
CALCIUM: 8.5 mg/dL — AB (ref 8.9–10.3)
CHLORIDE: 99 mmol/L — AB (ref 101–111)
CO2: 25 mmol/L (ref 22–32)
Creatinine, Ser: 3.46 mg/dL — ABNORMAL HIGH (ref 0.44–1.00)
GFR calc non Af Amer: 13 mL/min — ABNORMAL LOW (ref >60)
GFR, EST AFRICAN AMERICAN: 15 mL/min — AB (ref >60)
Glucose, Bld: 114 mg/dL — ABNORMAL HIGH (ref 65–99)
Potassium: 4.3 mmol/L (ref 3.5–5.1)
Sodium: 135 mmol/L (ref 135–145)
Total Bilirubin: 2.1 mg/dL — ABNORMAL HIGH (ref 0.3–1.2)
Total Protein: 4.7 g/dL — ABNORMAL LOW (ref 6.5–8.1)

## 2014-06-23 LAB — CBC
HEMATOCRIT: 41 % (ref 36.0–46.0)
Hemoglobin: 13.8 g/dL (ref 12.0–15.0)
MCH: 28.2 pg (ref 26.0–34.0)
MCHC: 33.7 g/dL (ref 30.0–36.0)
MCV: 83.7 fL (ref 78.0–100.0)
Platelets: 123 10*3/uL — ABNORMAL LOW (ref 150–400)
RBC: 4.9 MIL/uL (ref 3.87–5.11)
RDW: 18.9 % — ABNORMAL HIGH (ref 11.5–15.5)
WBC: 6.5 10*3/uL (ref 4.0–10.5)

## 2014-06-23 LAB — GLUCOSE, CAPILLARY
GLUCOSE-CAPILLARY: 125 mg/dL — AB (ref 65–99)
GLUCOSE-CAPILLARY: 143 mg/dL — AB (ref 65–99)
Glucose-Capillary: 112 mg/dL — ABNORMAL HIGH (ref 65–99)
Glucose-Capillary: 132 mg/dL — ABNORMAL HIGH (ref 65–99)
Glucose-Capillary: 117 mg/dL — ABNORMAL HIGH (ref 65–99)
Glucose-Capillary: 123 mg/dL — ABNORMAL HIGH (ref 65–99)
Glucose-Capillary: 111 mg/dL — ABNORMAL HIGH (ref 65–99)

## 2014-06-23 MED ORDER — POTASSIUM CHLORIDE 20 MEQ/15ML (10%) PO SOLN
20.0000 meq | Freq: Two times a day (BID) | ORAL | Status: DC
  Administered 2014-06-23 – 2014-06-25 (×4): 20 meq
  Filled 2014-06-23 (×7): qty 15

## 2014-06-23 MED ORDER — FUROSEMIDE 10 MG/ML IJ SOLN
40.0000 mg | Freq: Two times a day (BID) | INTRAMUSCULAR | Status: AC
  Administered 2014-06-23 – 2014-06-25 (×5): 40 mg via INTRAVENOUS
  Filled 2014-06-23 (×5): qty 4

## 2014-06-23 MED ORDER — ERYTHROMYCIN ETHYLSUCCINATE 400 MG/5ML PO SUSR
400.0000 mg | Freq: Three times a day (TID) | ORAL | Status: DC
  Administered 2014-06-24 (×2): 400 mg
  Filled 2014-06-23 (×6): qty 5

## 2014-06-23 MED ORDER — ERYTHROMYCIN ETHYLSUCCINATE 400 MG/5ML PO SUSR
400.0000 mg | Freq: Three times a day (TID) | ORAL | Status: DC
  Filled 2014-06-23: qty 5

## 2014-06-23 NOTE — Progress Notes (Signed)
PULMONARY / CRITICAL CARE MEDICINE   Name: Ana Hampton MRN: [id number] DOB: Dec 06, 1950    ADMISSION DATE:  06/22/2014  REFERRING MD :  [name]  CHIEF COMPLAINT:  Unresponsive  INITIAL PRESENTATION: 64 year old with extensive psych history adm via ED with AMS. She was not protecting her airway and [name] intubated patient. PCCM was called to admit. Patient was hypothermic on presentation. Multiple metabolic derangements  STUDIES:  5/06 TTE: EF 30 to 62%, grade 1 diastolic dysfx, PAS 43 mmHg 5/06 EEG: moderate diffuse slowing 5/06 Abd [place]: coarse liver ?cirrhosis, small amount of ascites 5/06 CT head: No CT evidence of acute intracranial abnormality 5/07 MRI brain: no acute findings, remote L MCA CVA  SIGNIFICANT EVENTS: 5/06 unresponsive, to ED and intubated, neuro consulted. Hyponatremia, polycythemia, suspected PNA 5/07 3% NS initiated 5/08 3% NS discontinued 5/09 Furosemide, milrinone infusions initiated. Discussion with her half brother (who has been her principle care provider) re: goals of care. No CPR/ACLS. No HD. Reassess progress in a couple of days and consider discontinuation of vent if no significant improvement 5/10 Precedex initiated for agitation 5/11 Remained encephalopathic. Not F/C. Cr continued to worsen 5/12 High GRVs - EES initiated. Constipation - bowel regimen initiated. Agitated in WUA but not F/C. Did not tolerate PSV 5/13 F/C intermittently. Tolerated SBT. Persistent changes of pulm edema/effusions. Family members failed to show for planned conference to discuss goals and options of care 5/15: awake, tolerating PSV, awaiting family to discuss goals of care.   VITAL SIGNS: Temp:  [97.9 F (36.6 C)-99.3 F (37.4 C)] 97.9 F (36.6 C) (05/15 0836) Pulse Rate:  [57-95] 80 (05/15 0900) Resp:  [0-27] 18 (05/15 0900) BP: (99-117)/(49-62) 111/59 mmHg (05/15 0900) SpO2:  [85 %-100 %] 90 % (05/15 0900) FiO2 (%):  [40 %] 40 % (05/15 0820) Weight:  [115.8 kg (255 lb 4.7  oz)] 115.8 kg (255 lb 4.7 oz) (05/15 0313) HEMODYNAMICS:   VENTILATOR SETTINGS: Vent Mode:  [-] PSV;CPAP FiO2 (%):  [40 %] 40 % Set Rate:  [14 bmp] 14 bmp Vt Set:  [470 mL] 470 mL PEEP:  [5 cmH20] 5 cmH20 Pressure Support:  [5 cmH20] 5 cmH20 Plateau Pressure:  [11 cmH20-18 cmH20] 17 cmH20 INTAKE / OUTPUT:  Intake/Output Summary (Last 24 hours) at 06/23/14 1042 Last data filed at 06/23/14 0900  Gross per 24 hour  Intake 2709.66 ml  Output   1120 ml  Net 1589.66 ml    PHYSICAL EXAMINATION: General: Obese, RASS -1, generalized weakness, but follows commands Neuro: MAEs, tries to communicate  HEENT:  [place]/AT, orally intubated  Cardiovascular:  Reg, no M Lungs:  Diffuse rhonchi, decreased on right  Abdomen:  Soft, NT, ND and +BS. Ext: Severe erythema diffusely, anasarca  LABS:  CBC  Recent Labs Lab 06/19/14 0510 06/20/14 0420 06/23/14 0314  WBC 6.7 7.9 6.5  HGB 16.1* 15.2* 13.8  HCT 46.2* 44.3 41.0  PLT 90* 98* 123*   Coag's No results for input(s): APTT, INR in the last 168 hours. BMET  Recent Labs Lab 06/20/14 0420 06/21/14 0446 06/23/14 0314  NA 129* 129* 135  K 4.6 4.2 4.3  CL 95* 95* 99*  CO2 23 23 25   BUN 70* 71* 83*  CREATININE 3.64* 3.74* 3.46*  GLUCOSE 92 116* 114*   Electrolytes  Recent Labs Lab 06/20/14 0420 06/21/14 0446 06/23/14 0314  CALCIUM 8.4* 8.5* 8.5*     Sepsis Markers  Recent Labs Lab 06/19/14 0510  PROCALCITON 0.86   ABG  No results for input(s): PHART, PCO2ART, PO2ART in the last 168 hours. Liver Enzymes  Recent Labs Lab 06/20/14 0420 06/23/14 0314  AST 32 31  ALT 27 29  ALKPHOS 82 166*  BILITOT 2.0* 2.1*  ALBUMIN 2.0* 1.8*   Cardiac Enzymes No results for input(s): TROPONINI, PROBNP in the last 168 hours. Glucose  Recent Labs Lab 06/22/14 1131 06/22/14 1649 06/22/14 2010 06/22/14 2348 06/23/14 0326 06/23/14 0839  GLUCAP 117* 119* 128* 125* 112* 132*   CXR: NSC CM, edema, effusions   ASSESSMENT  / PLAN:  PULMONARY ETT 5/06 >>  A: Acute (?on chronic) resp failure (initial P/F ratio s/p intubation 186) Ineffective airway protection Severe pulm edema R>L effusion  P:   Cont vent support PSV as tolerated Cont vent bundle Cont lasix -->would prefer not to do thora in this setting Repeat CXR in am Daily SBT if/when meets criteria No extubation until cognition improves or until DNI established  CARDIOVASCULAR L IJ TLC 5/6 >>  A:  Shock, resolved Severe cardiomyopathy Acute systolic heart failure A-fib with associated hypotension that [name] cardioverted Ectopy 5/14 P:  Cont to monitor DNR - not a candidate for ACLS/CPR  RENAL A:   Hyponatremia, multifactorial - improving AKI, nonoliguric Anasarca Not a candidate for HD based on very poor functional status at baseline P:   Monitor BMET intermittently Monitor I/Os Correct electrolytes as indicated Resumed lasix   GASTROINTESTINAL A:   Obesity Protein calorie malnutrition  High GRVs - improved Constipation P:   SUP: enteral famotidine Cont TFs EES as motility agent initiated 5/12 Bowel regimen initiated 5/12  HEMATOLOGIC A:   Polycythemia, suspect chronic hypoxia Mild thrombocytopenia - suspect chronic P:  DVT px: SQ heparin Monitor CBC intermittently Transfuse per usual ICU guidelines  INFECTIOUS A:   Mildly elevated PCT of unclear etiology "+" BC on admission - contaminant Possible PNA, treated P:   MRSA PCR 5/06 >> POS Urine 5/06 >> NEG Resp 5/06 >> NOF Blood 5/06 >> 1/2 coag neg staph (contaminant)  Vanc 5/06 >> 5/08 Pip-tazo 5/06 >> 5/11  ENDOCRINE A:   Hyperglycemia without prior DM history Hypothyroidism >> TSH 5.063 from 5/07. 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 Shock resolved. Renal fxn not better. MS remains barrier to extubation. Need to get volume off. 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 Family planning on arriving Monday. Should be able to get her off vent. Would like to confirm NO RE-INTUBATION prior to this.   Erick Colace ACNP-BC [place] Pager # [phone] OR # [phone] if no answer   Attending Note:  I have examined patient, reviewed labs, studies and notes. I have discussed the case with Jerrye Bushy, and I agree with the data and plans as amended above. Patient remains critically ill with some  Improvement in hemodynamics. Supporting renal, hemodynamic, metabolic disarray - all resulting in resp failure. She is sedated, has not tolerated SBT. Will need to determine with family when to perform one-way extubation, hopefully for success.  Independent critical care time is 32 minutes.   Baltazar Apo, MD, PhD 06/23/2014, 2:23 PM [facility] [phone] or if no answer [phone]

## 2014-06-24 ENCOUNTER — Inpatient Hospital Stay (HOSPITAL_COMMUNITY): Payer: Medicare Other

## 2014-06-24 LAB — GLUCOSE, CAPILLARY
GLUCOSE-CAPILLARY: 116 mg/dL — AB (ref 65–99)
GLUCOSE-CAPILLARY: 119 mg/dL — AB (ref 65–99)
Glucose-Capillary: 132 mg/dL — ABNORMAL HIGH (ref 65–99)
Glucose-Capillary: 106 mg/dL — ABNORMAL HIGH (ref 65–99)
Glucose-Capillary: 94 mg/dL (ref 65–99)

## 2014-06-24 LAB — BASIC METABOLIC PANEL
Anion gap: 9 (ref 5–15)
BUN: 93 mg/dL — ABNORMAL HIGH (ref 6–20)
CO2: 26 mmol/L (ref 22–32)
CREATININE: 3.34 mg/dL — AB (ref 0.44–1.00)
Calcium: 8.5 mg/dL — ABNORMAL LOW (ref 8.9–10.3)
Chloride: 100 mmol/L — ABNORMAL LOW (ref 101–111)
GFR, EST AFRICAN AMERICAN: 16 mL/min — AB (ref >60)
GFR, EST NON AFRICAN AMERICAN: 14 mL/min — AB (ref >60)
Glucose, Bld: 116 mg/dL — ABNORMAL HIGH (ref 65–99)
Potassium: 4.2 mmol/L (ref 3.5–5.1)
SODIUM: 135 mmol/L (ref 135–145)

## 2014-06-24 LAB — AMMONIA: Ammonia: 43 umol/L — ABNORMAL HIGH (ref 9–35)

## 2014-06-24 MED ORDER — CETYLPYRIDINIUM CHLORIDE 0.05 % MT LIQD
7.0000 mL | Freq: Two times a day (BID) | OROMUCOSAL | Status: DC
  Administered 2014-06-25: 7 mL via OROMUCOSAL

## 2014-06-24 MED ORDER — FENTANYL CITRATE (PF) 100 MCG/2ML IJ SOLN
12.5000 ug | INTRAMUSCULAR | Status: DC | PRN
  Administered 2014-06-25: 25 ug via INTRAVENOUS
  Filled 2014-06-24: qty 2

## 2014-06-24 MED ORDER — CHLORHEXIDINE GLUCONATE 0.12 % MT SOLN: 15.0000 mL | Freq: Two times a day (BID) | OROMUCOSAL | Status: DC

## 2014-06-24 MED ORDER — CHLORHEXIDINE GLUCONATE 0.12 % MT SOLN
15.0000 mL | Freq: Two times a day (BID) | OROMUCOSAL | Status: DC
  Administered 2014-06-24 – 2014-06-26 (×3): 15 mL via OROMUCOSAL
  Filled 2014-06-24 (×5): qty 15

## 2014-06-24 MED ORDER — SODIUM CHLORIDE 0.9 % IV SOLN
0.0000 ug/h | INTRAVENOUS | Status: DC
  Administered 2014-06-24: 25 ug/h via INTRAVENOUS
  Filled 2014-06-24: qty 50

## 2014-06-24 MED ORDER — FENTANYL CITRATE (PF) 100 MCG/2ML IJ SOLN: 50.0000 ug | INTRAMUSCULAR | Status: DC | PRN

## 2014-06-24 NOTE — Procedures (Signed)
Extubation Procedure Note  Patient Details:   Name: Ana Hampton DOB: July 10, 1950 MRN: [id number]   Airway Documentation:     Evaluation  O2 sats: stable throughout Complications: No apparent complications Patient did tolerate procedure well. Bilateral Breath Sounds: Clear, Diminished Suctioning: Airway Yes   Patient extubated to 6L nasal cannula per MD order.  Positive cuff leak noted.  No evidence of stridor.  Patient unable to follow commands at this time.  Sats currently 91%.  No apparent complications.  Philomena Doheny 06/24/2014, 12:21 PM

## 2014-06-24 NOTE — Progress Notes (Signed)
PULMONARY / CRITICAL CARE MEDICINE   Name: Ana Hampton MRN: [id number] DOB: 30-May-1950    ADMISSION DATE:  06/20/2014  REFERRING MD :  [name]  CHIEF COMPLAINT:  Unresponsive  INITIAL PRESENTATION: 64 year old with extensive psych history adm via ED with AMS. She was not protecting her airway and [name] intubated patient. PCCM was called to admit. Patient was hypothermic on presentation. Multiple metabolic derangements  STUDIES:  5/06 TTE: EF 30 to 67%, grade 1 diastolic dysfx, PAS 43 mmHg 5/06 EEG: moderate diffuse slowing 5/06 Abd [place]: coarse liver ?cirrhosis, small amount of ascites 5/06 CT head: No CT evidence of acute intracranial abnormality 5/07 MRI brain: no acute findings, remote L MCA CVA  SIGNIFICANT EVENTS: 5/06 unresponsive, to ED and intubated, neuro consulted. Hyponatremia, polycythemia, suspected PNA 5/07 3% NS initiated 5/08 3% NS discontinued 5/09 Furosemide, milrinone infusions initiated. Discussion with her half brother (who has been her principle care provider) re: goals of care. No CPR/ACLS. No HD. Reassess progress in a couple of days and consider discontinuation of vent if no significant improvement 5/10 Precedex initiated for agitation 5/11 Remained encephalopathic. Not F/C. Cr continued to worsen 5/12 High GRVs - EES initiated. Constipation - bowel regimen initiated. Agitated in WUA but not F/C. Did not tolerate PSV 5/13 F/C intermittently. Tolerated SBT. Persistent changes of pulm edema/effusions. Family members failed to show for planned conference to discuss goals and options of care 5/15: awake, tolerating PSV, awaiting family to discuss goals of care.    SUBJECTIVE :  Passing SBT  VITAL SIGNS: Temp:  [97.6 F (36.4 C)-99.3 F (37.4 C)] 99.3 F (37.4 C) (05/16 0724) Pulse Rate:  [65-103] 79 (05/16 0800) Resp:  [12-26] 12 (05/16 0800) BP: (91-118)/(51-70) 103/53 mmHg (05/16 0800) SpO2:  [90 %-98 %] 90 % (05/16 0800) FiO2 (%):  [40 %] 40 % (05/16  0800) Weight:  [117.8 kg (259 lb 11.2 oz)] 117.8 kg (259 lb 11.2 oz) (05/16 0359) HEMODYNAMICS:   VENTILATOR SETTINGS: Vent Mode:  [-] PSV;CPAP FiO2 (%):  [40 %] 40 % Set Rate:  [14 bmp] 14 bmp Vt Set:  [470 mL] 470 mL PEEP:  [5 cmH20] 5 cmH20 Pressure Support:  [5 cmH20-12 cmH20] 5 cmH20 Plateau Pressure:  [19 cmH20] 19 cmH20 INTAKE / OUTPUT:  Intake/Output Summary (Last 24 hours) at 06/24/14 0918 Last data filed at 06/24/14 0800  Gross per 24 hour  Intake 2259.81 ml  Output   2800 ml  Net -540.19 ml    PHYSICAL EXAMINATION: General: on vent, no distress HENT:NCAT, ETT in place Eyes: EOMi, anicteric PULM: CTA B, normal effort on PSV CV: RRR, no mr GI: BS+, soft, nontender MSK: normal bulk and tone Derm: edema all over, no rash or skin breakdown Neuro: Awake on vent, follows simple commands, somewhat sonorous  LABS:  CBC  Recent Labs Lab 06/19/14 0510 06/20/14 0420 06/23/14 0314  WBC 6.7 7.9 6.5  HGB 16.1* 15.2* 13.8  HCT 46.2* 44.3 41.0  PLT 90* 98* 123*   Coag's No results for input(s): APTT, INR in the last 168 hours. BMET  Recent Labs Lab 06/21/14 0446 06/23/14 0314 06/24/14 0416  NA 129* 135 135  K 4.2 4.3 4.2  CL 95* 99* 100*  CO2 23 25 26   BUN 71* 83* 93*  CREATININE 3.74* 3.46* 3.34*  GLUCOSE 116* 114* 116*   Electrolytes  Recent Labs Lab 06/21/14 0446 06/23/14 0314 06/24/14 0416  CALCIUM 8.5* 8.5* 8.5*     Sepsis Markers  Recent Labs Lab 06/19/14 0510  PROCALCITON 0.86   ABG No results for input(s): PHART, PCO2ART, PO2ART in the last 168 hours. Liver Enzymes  Recent Labs Lab 06/20/14 0420 06/23/14 0314  AST 32 31  ALT 27 29  ALKPHOS 82 166*  BILITOT 2.0* 2.1*  ALBUMIN 2.0* 1.8*   Cardiac Enzymes No results for input(s): TROPONINI, PROBNP in the last 168 hours. Glucose  Recent Labs Lab 06/23/14 1203 06/23/14 1628 06/23/14 1929 06/23/14 2321 06/24/14 0347 06/24/14 0742  GLUCAP 117* 123* 143* 111* 116*  132*   5/ 16 CXR images reviewed: ETT in place, bilateral effusions and pulm edema   ASSESSMENT / PLAN:  PULMONARY ETT 5/06 >>  A: Acute (?on chronic) resp failure with hypoxemia due to pulmonary edema Passing SBT on 5/16 R>L effusion  P:   Continue lasix Extubation today, one way SLP eval post extubation  CARDIOVASCULAR L IJ TLC 5/6 >>  A:  Shock, resolved Severe cardiomyopathy > acute decompensated systolic heart failure (LVEF 30-35%, RVSP 43) A-fib with associated hypotension that [name] cardioverted Ectopy 5/14 P:  Cont to monitor DNR - not a candidate for ACLS/CPR Continue lasix Tele  RENAL A:   Hyponatremia, multifactorial - improving AKI, nonoliguric > also improving Anasarca Not a candidate for HD based on very poor functional status at baseline P:   Continue lasix Monitor BMET and UOP Replace electrolytes as needed  GASTROINTESTINAL A:   Obesity Protein calorie malnutrition  High GRVs - improved Constipation P:   SUP: enteral famotidine Cont TFs EES as motility agent initiated 5/12 NPO post extubation until SLP evaluation Bowel regimen  HEMATOLOGIC A:   Polycythemia, suspect chronic hypoxia Mild thrombocytopenia - suspect chronic P:  DVT px: SQ heparin Monitor CBC intermittently Transfuse per usual ICU guidelines  INFECTIOUS A:   Mildly elevated PCT of unclear etiology "+" BC on admission - contaminant Possible PNA, treated P:   MRSA PCR 5/06 >> POS Urine 5/06 >> NEG Resp 5/06 >> NOF Blood 5/06 >> 1/2 coag neg staph (contaminant)  Vanc 5/06 >> 5/08 Pip-tazo 5/06 >> 5/11  ENDOCRINE A:   Hyperglycemia without prior DM history Hypothyroidism >> TSH 5.063 from 5/07. 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  I have reviewed her record extensively and then took the time to call her brother Ana Hampton who is our closest available relative.  He says he can't come in today due to his health issues.  He describes her as someone with years of illness which have been worsening in the last two years mostly due to self neglect despite his offering to help on multiple occassions.  I explained she has multiple chronic illnesses (CKD, Cirrhosis and CHF) which we have been trying to control to the best of our ability but she has remained critically ill. However she is following commands today, her renal failure is improving and she is passing an SBT, so this may be the best opportunity we have for an extubation.  Her brother states that he doesn't think we should put her back on a vent should she fail extubation which is completely reasonable.  Will extubate and hope she does well,  continue lasix, other interventions.  If she fails we will make her comfortable.    Independent critical care time is 60 minutes.   Roselie Awkward, MD [facility] Pager: [phone] Cell: [phone] If no response, call [phone]

## 2014-06-24 NOTE — Progress Notes (Signed)
[facility] Progress Note Patient Name: Ana Hampton DOB: 1951/01/08 MRN: [id number]   Date of Service  06/24/2014  HPI/Events of Note  Severe agitation on precedex gtt  eICU Interventions  Add fent gtt     Intervention Category Major Interventions: Delirium, psychosis, severe agitation - evaluation and management  Ana Hampton V. 06/24/2014, 12:56 AM

## 2014-06-25 DIAGNOSIS — K7469 Other cirrhosis of liver: Secondary | ICD-10-CM

## 2014-06-25 DIAGNOSIS — G9340 Encephalopathy, unspecified: Secondary | ICD-10-CM

## 2014-06-25 LAB — CBC WITH DIFFERENTIAL/PLATELET
Basophils Absolute: 0 10*3/uL (ref 0.0–0.1)
Basophils Relative: 0 % (ref 0–1)
EOS ABS: 0 10*3/uL (ref 0.0–0.7)
EOS PCT: 0 % (ref 0–5)
HCT: 41 % (ref 36.0–46.0)
Hemoglobin: 13.4 g/dL (ref 12.0–15.0)
LYMPHS PCT: 6 % — AB (ref 12–46)
Lymphs Abs: 0.7 10*3/uL (ref 0.7–4.0)
MCH: 28 pg (ref 26.0–34.0)
MCHC: 32.7 g/dL (ref 30.0–36.0)
MCV: 85.6 fL (ref 78.0–100.0)
Monocytes Absolute: 0.4 10*3/uL (ref 0.1–1.0)
Monocytes Relative: 4 % (ref 3–12)
Neutro Abs: 11.3 10*3/uL — ABNORMAL HIGH (ref 1.7–7.7)
Neutrophils Relative %: 90 % — ABNORMAL HIGH (ref 43–77)
Platelets: 159 10*3/uL (ref 150–400)
RBC: 4.79 MIL/uL (ref 3.87–5.11)
RDW: 19.4 % — AB (ref 11.5–15.5)
WBC: 12.4 10*3/uL — ABNORMAL HIGH (ref 4.0–10.5)

## 2014-06-25 LAB — BASIC METABOLIC PANEL
ANION GAP: 11 (ref 5–15)
BUN: 98 mg/dL — AB (ref 6–20)
CALCIUM: 9.3 mg/dL (ref 8.9–10.3)
CO2: 26 mmol/L (ref 22–32)
CREATININE: 3.51 mg/dL — AB (ref 0.44–1.00)
Chloride: 104 mmol/L (ref 101–111)
GFR calc Af Amer: 15 mL/min — ABNORMAL LOW (ref >60)
GFR calc non Af Amer: 13 mL/min — ABNORMAL LOW (ref >60)
Glucose, Bld: 89 mg/dL (ref 65–99)
Potassium: 4.2 mmol/L (ref 3.5–5.1)
Sodium: 141 mmol/L (ref 135–145)

## 2014-06-25 LAB — FERRITIN: Ferritin: 375 ng/mL — ABNORMAL HIGH (ref 11–307)

## 2014-06-25 LAB — GLUCOSE, CAPILLARY
GLUCOSE-CAPILLARY: 82 mg/dL (ref 65–99)
GLUCOSE-CAPILLARY: 89 mg/dL (ref 65–99)
GLUCOSE-CAPILLARY: 98 mg/dL (ref 65–99)
Glucose-Capillary: 85 mg/dL (ref 65–99)
Glucose-Capillary: 95 mg/dL (ref 65–99)
Glucose-Capillary: 96 mg/dL (ref 65–99)

## 2014-06-25 MED ORDER — MORPHINE SULFATE 2 MG/ML IJ SOLN
2.0000 mg | INTRAMUSCULAR | Status: DC | PRN
  Administered 2014-06-25: 2 mg via INTRAVENOUS
  Filled 2014-06-25: qty 1

## 2014-06-25 MED ORDER — LEVOTHYROXINE SODIUM 100 MCG IV SOLR
25.0000 ug | Freq: Every day | INTRAVENOUS | Status: DC
  Administered 2014-06-25: 25 ug via INTRAVENOUS
  Filled 2014-06-25 (×2): qty 5

## 2014-06-25 NOTE — Progress Notes (Signed)
Air Bed replacement received for [phone] from portable equipment, RN Irekia notified to transfer patient.

## 2014-06-25 NOTE — Progress Notes (Signed)
Report received from [facility], air mattress replacement for bed ordered for 5W28 per MD order for patient transfer outside of ICU. Will contact RN Cecil Cranker for patient transfer once air mattress replacement for bed is received on [facility].

## 2014-06-25 NOTE — Progress Notes (Signed)
NURSING PROGRESS NOTE  Ana Hampton [id number] Transfer Data: 06/25/2014 2:01 PM Attending Provider: Rush Farmer, MD PCP:No primary care provider on file. Code Status: DNR Allergies:  Abilify; Geodon; Niaspan; and Prednisone Past Medical History:   has no past medical history on file. Past Surgical History:   has no past surgical history on file. Social History:     Ana Hampton is a 64 y.o. female patient transferred from [facility].  Blood pressure 123/55, pulse 109, temperature 98.5 F (36.9 C), temperature source Oral, resp. rate 23, height 5\' 3"  (1.6 m), weight 117.8 kg (259 lb 11.2 oz), SpO2 94 %.  Cardiac Monitoring:  None ordered.  IV Fluids:  IV in place, occlusive dsg intact without redness, IV cath internal jugular right, condition patent and no redness, normal saline.   Skin: See Assessment Charting  Will continue to evaluate and treat per MD orders.   Hendricks Limes RN, BS, BSN

## 2014-06-25 NOTE — Progress Notes (Signed)
RN called stating that Pt is comfort care but sounds as though she has junk in her throat. RT asked to evaluate for possible HHN tx. Pt NT suctioned x1 in each nare with large amount of yellow/white mucous obtained. Pt appears more comfortable after. RT will continue to monitor.

## 2014-06-25 NOTE — Progress Notes (Signed)
PT Cancellation Note  Patient Details Name: Ana Hampton MRN: [id number] DOB: Oct 15, 1950   Cancelled Treatment:    Reason Eval/Treat Not Completed: PT screened, no needs identified, will sign off.  Discussed pt case with RN who states that pt was made comfort care and PT is no longer appropriate. Please reconsult if needs change.    Rolinda Roan 06/25/2014, 10:07 AM   Rolinda Roan, PT, DPT Acute Rehabilitation Services Pager: [phone]

## 2014-06-25 NOTE — Evaluation (Signed)
Clinical/Bedside Swallow Evaluation Patient Details  Name: Ana Hampton MRN: [id number] Date of Birth: 09/09/1950  Today's Date: 06/25/2014 Time: SLP Start Time (ACUTE ONLY): 1114 SLP Stop Time (ACUTE ONLY): 1125 SLP Time Calculation (min) (ACUTE ONLY): 11 min  Past Medical History: No past medical history on file. Past Surgical History: No past surgical history on file. HPI:  64 year old with extensive psych history adm via ED with AMS. She was not protecting her airway and [name] intubated patient.  ETT 5/6-5/16 (one -way extubation).  Dx include acute on chronic resp failure, severe cardiomyopathy, anasarca, acute encephalopathy.  Critical care notes indicate plan for full comfort measures; Palliative care consulted for symptom management.  SLP swallow eval orders to determine safest PO diet.     Assessment / Plan / Recommendation Clinical Impression  Pt presents with dysphagia s/p prolonged intubation, deconditioning, encephalopathy.  Not following commands consistently; no attempts to verbalize.  Pt with recognition of ice chips/tspns water, and accepted tspns of each with sufficient oral manipulation.  Palpable swallow elicited, but this led to delayed, weak cough and audible, wet congestion.  Pt likely aspirating.  She assented to further ice chips with head nod, but did not initiate requests for more.    Given shift to comfort approach, recommend allowing ice chips and sips of water with HOB elevated.  If Ana Hampton' MS improves and she expresses a desire to eat, advance diet per her preferences.  Given status today, she would not be able to manipulate solid foods.  No further SLP needs identified - will sign off.  D/W RN.    Aspiration Risk  Severe    Diet Recommendation Ice chips PRN after oral care (sips of water)        Other  Recommendations Oral Care Recommendations: Oral care QID              SLP Swallow Goals  n/a   Swallow Study Prior Functional Status        General Date of Onset: 07/08/2014 Other Pertinent Information: 64 year old with extensive psych history adm via ED with AMS. She was not protecting her airway and [name] intubated patient.  ETT 5/6-5/16 (one -way extubation).  Dx include acute on chronic resp failure, severe cardiomyopathy, anasarca, acute encephalopathy.  Critical care notes indicate plan for full comfort measures; Palliative care consulted for symptom management.  SLP swallow eval orders to determine safest PO diet.   Type of Study: Bedside swallow evaluation Previous Swallow Assessment: none per records Diet Prior to this Study: NPO Temperature Spikes Noted: No Respiratory Status: Supplemental O2 delivered via (comment) History of Recent Intubation: Yes Length of Intubations (days): 10 days Date extubated: 06/24/14 Behavior/Cognition: Requires cueing;Lethargic/Drowsy Self-Feeding Abilities: Total assist Patient Positioning: Partially reclined Baseline Vocal Quality:  (no phonation) Volitional Cough: Cognitively unable to elicit Volitional Swallow: Unable to elicit    Oral/Motor/Sensory Function Overall Oral Motor/Sensory Function: Appears within functional limits for tasks assessed   Ice Chips Ice chips: Impaired Presentation: Spoon Pharyngeal Phase Impairments: Suspected delayed Swallow;Decreased hyoid-laryngeal movement;Cough - Delayed   Thin Liquid Thin Liquid: Impaired Presentation: Spoon Pharyngeal  Phase Impairments: Multiple swallows;Decreased hyoid-laryngeal movement;Suspected delayed Swallow;Cough - Delayed    Nectar Thick Nectar Thick Liquid: Not tested   Honey Thick Honey Thick Liquid: Not tested   Puree Puree: Not tested   Solid  Ana Hampton, [place] CCC/SLP Pager [phone]     Solid: Not tested       Ana Hampton  06/25/2014,11:40 AM

## 2014-06-25 NOTE — Progress Notes (Signed)
PULMONARY / CRITICAL CARE MEDICINE   Name: Ana Hampton MRN: [id number] DOB: 04/08/1950    ADMISSION DATE:  06/15/2014  REFERRING MD :  [name]  CHIEF COMPLAINT:  Unresponsive  INITIAL PRESENTATION: 64 year old with extensive psych history adm via ED with AMS. She was not protecting her airway and [name] intubated patient. PCCM was called to admit. Patient was hypothermic on presentation. Multiple metabolic derangements  STUDIES:  5/06 TTE: EF 30 to 59%, grade 1 diastolic dysfx, PAS 43 mmHg 5/06 EEG: moderate diffuse slowing 5/06 Abd [place]: coarse liver ?cirrhosis, small amount of ascites 5/06 CT head: No CT evidence of acute intracranial abnormality 5/07 MRI brain: no acute findings, remote L MCA CVA  SIGNIFICANT EVENTS: 5/06 unresponsive, to ED and intubated, neuro consulted. Hyponatremia, polycythemia, suspected PNA 5/07 3% NS initiated 5/08 3% NS discontinued 5/09 Furosemide, milrinone infusions initiated. Discussion with her half brother (who has been her principle care provider) re: goals of care. No CPR/ACLS. No HD. Reassess progress in a couple of days and consider discontinuation of vent if no significant improvement 5/10 Precedex initiated for agitation 5/11 Remained encephalopathic. Not F/C. Cr continued to worsen 5/12 High GRVs - EES initiated. Constipation - bowel regimen initiated. Agitated in WUA but not F/C. Did not tolerate PSV 5/13 F/C intermittently. Tolerated SBT. Persistent changes of pulm edema/effusions. Family members failed to show for planned conference to discuss goals and options of care 5/15: awake, tolerating PSV, awaiting family to discuss goals of care.  5/16: one way extubation   SUBJECTIVE :  Extubated yesterday, short of breath, struggling this morning ,renal function worse  VITAL SIGNS: Temp:  [97.4 F (36.3 C)-98.7 F (37.1 C)] 98.7 F (37.1 C) (05/17 0820) Pulse Rate:  [77-125] 111 (05/17 0900) Resp:  [14-28] 23 (05/17 0900) BP: (94-153)/(42-103)  119/42 mmHg (05/17 0900) SpO2:  [66 %-99 %] 94 % (05/17 0900) HEMODYNAMICS:   VENTILATOR SETTINGS:   INTAKE / OUTPUT:  Intake/Output Summary (Last 24 hours) at 06/25/14 0944 Last data filed at 06/25/14 0800  Gross per 24 hour  Intake    411 ml  Output   1385 ml  Net   -974 ml    PHYSICAL EXAMINATION: General: increased work of breathing HENT:NCAT, mm dry Eyes: EOMi, anicteric PULM: few crackles, increased effort CV: RRR, no mr GI: BS+, soft, nontender MSK: normal bulk and tone Derm: edema all over, diffuse redness feet Neuro: Awake does not follow commands  LABS:  CBC  Recent Labs Lab 06/20/14 0420 06/23/14 0314 06/25/14 0338  WBC 7.9 6.5 12.4*  HGB 15.2* 13.8 13.4  HCT 44.3 41.0 41.0  PLT 98* 123* 159   Coag's No results for input(s): APTT, INR in the last 168 hours. BMET  Recent Labs Lab 06/23/14 0314 06/24/14 0416 06/25/14 0338  NA 135 135 141  K 4.3 4.2 4.2  CL 99* 100* 104  CO2 25 26 26   BUN 83* 93* 98*  CREATININE 3.46* 3.34* 3.51*  GLUCOSE 114* 116* 89   Electrolytes  Recent Labs Lab 06/23/14 0314 06/24/14 0416 06/25/14 0338  CALCIUM 8.5* 8.5* 9.3     Sepsis Markers  Recent Labs Lab 06/19/14 0510  PROCALCITON 0.86   ABG No results for input(s): PHART, PCO2ART, PO2ART in the last 168 hours. Liver Enzymes  Recent Labs Lab 06/20/14 0420 06/23/14 0314  AST 32 31  ALT 27 29  ALKPHOS 82 166*  BILITOT 2.0* 2.1*  ALBUMIN 2.0* 1.8*   Cardiac Enzymes No results for  input(s): TROPONINI, PROBNP in the last 168 hours. Glucose  Recent Labs Lab 06/24/14 1213 06/24/14 1616 06/24/14 2018 06/24/14 2345 06/25/14 0350 06/25/14 0818  GLUCAP 119* 106* 94 89 82 85   5/ 16 CXR : ETT in place, bilateral effusions and pulm edema   ASSESSMENT / PLAN:  PULMONARY ETT 5/06 >> 5/16 A: Acute (?on chronic) resp failure with hypoxemia due to pulmonary edema  Extubated on 5/16, struggling on 5/17 R>L effusion  P:   Continue lasix  for comfort Add morphine for comfort SLP eval OK Oral care  CARDIOVASCULAR L IJ TLC 5/6 >>  A:  Shock, resolved Severe cardiomyopathy > acute decompensated systolic heart failure (LVEF 30-35%, RVSP 43) A-fib with associated hypotension that [name] cardioverted P:  DNR  Continue lasix Tele  RENAL A:   AKI, nonoliguric > worsening with lasix Anasarca Not a candidate for HD based on very poor functional status at baseline P:   Continue lasix for comfort Replace electrolytes as needed  GASTROINTESTINAL A:   Obesity Protein calorie malnutrition  High GRVs - improved Constipation P:   SLP eval Bowel regimen  HEMATOLOGIC A:   Polycythemia, suspect chronic hypoxia Mild thrombocytopenia - suspect chronic P:  DVT px: SQ heparin Monitor CBC intermittently Transfuse per usual ICU guidelines  INFECTIOUS A:   "+" BC on admission - contaminant Possible PNA, treated P:   MRSA PCR 5/06 >> POS Urine 5/06 >> NEG Resp 5/06 >> NOF Blood 5/06 >> 1/2 coag neg staph (contaminant)  Vanc 5/06 >> 5/08 Pip-tazo 5/06 >> 5/11  ENDOCRINE A:   Hyperglycemia without prior DM history Hypothyroidism >> TSH 5.063 from 5/07. 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  Case was discussed extensively with her brother [name] who agreed to a one way extubation and to move forward with full comfort measures if her condition declined.  Today she appears to be more short of breath.  Will move forward with comfort measures as Richard and I discussed.  I called Richard this morning at 0945 but had to leave a message.  Will consult palliative care to assist with  symptom management.  Transfer to [place], MD [place] P[facility]M Pager: [phone] Cell: [phone] If no response, call [phone]

## 2014-06-25 NOTE — Progress Notes (Signed)
Nutrition Brief Note  Chart reviewed. Pt now transitioning to comfort care.  No further nutrition interventions warranted at this time.  Please re-consult as needed.    Ziair Penson, RD, LDN, CNSC Pager [phone] After Hours Pager [phone]    

## 2014-06-26 DIAGNOSIS — Z515 Encounter for palliative care: Secondary | ICD-10-CM

## 2014-06-26 DIAGNOSIS — R0600 Dyspnea, unspecified: Secondary | ICD-10-CM

## 2014-06-26 DIAGNOSIS — I4891 Unspecified atrial fibrillation: Secondary | ICD-10-CM

## 2014-06-26 DIAGNOSIS — J9601 Acute respiratory failure with hypoxia: Secondary | ICD-10-CM

## 2014-06-26 DIAGNOSIS — I429 Cardiomyopathy, unspecified: Secondary | ICD-10-CM

## 2014-06-26 DIAGNOSIS — R0689 Other abnormalities of breathing: Secondary | ICD-10-CM

## 2014-06-26 DIAGNOSIS — N179 Acute kidney failure, unspecified: Secondary | ICD-10-CM

## 2014-06-26 LAB — GLUCOSE, CAPILLARY
GLUCOSE-CAPILLARY: 110 mg/dL — AB (ref 65–99)
GLUCOSE-CAPILLARY: 102 mg/dL — AB (ref 65–99)

## 2014-06-26 MED ORDER — MORPHINE SULFATE 2 MG/ML IJ SOLN: 2.0000 mg | INTRAMUSCULAR | Status: DC | PRN

## 2014-06-26 MED ORDER — SCOPOLAMINE 1 MG/3DAYS TD PT72
1.0000 | MEDICATED_PATCH | TRANSDERMAL | Status: DC
  Administered 2014-06-26: 1.5 mg via TRANSDERMAL
  Filled 2014-06-26: qty 1

## 2014-06-26 MED ORDER — LORAZEPAM 2 MG/ML IJ SOLN: 1.0000 mg | INTRAMUSCULAR | Status: DC | PRN

## 2014-06-26 MED ORDER — MORPHINE BOLUS VIA INFUSION
2.0000 mg | INTRAVENOUS | Status: DC | PRN
  Filled 2014-06-26: qty 4

## 2014-06-26 MED ORDER — SODIUM CHLORIDE 0.9 % IV SOLN
4.0000 mg/h | INTRAVENOUS | Status: DC
  Administered 2014-06-26: 1 mg/h via INTRAVENOUS
  Filled 2014-06-26: qty 10

## 2014-06-26 MED ORDER — GLYCOPYRROLATE 0.2 MG/ML IJ SOLN
0.2000 mg | INTRAMUSCULAR | Status: DC
  Administered 2014-06-26 (×2): 0.2 mg via INTRAVENOUS
  Filled 2014-06-26 (×6): qty 1

## 2014-06-29 NOTE — Discharge Summary (Signed)
Death Summary  Ana Hampton MIW:[id number] DOB: Jun 29, 1950 DOA: 07-05-2014  PCP: No primary care provider on file. PCP/Office notified: via Electronic Record  Admit date: 2014-07-05 Date of Death: July 20, 2014  Final Diagnoses:  Acute respiratory failure with hypoxemia Altered mental status Polycythemia AKI (acute kidney injury) Severe decompensated systolic heart failure PAF Morbid obesity Hx of stroke  Hypothyroidism   History of present illness:  64 year old with extensive psych history but otherwise not much is known who was acting different the day prior to presentation, family did not think much of it. They went to sleep woke up this AM and found her unresponsive on the floor. EMS was called and patient was brought to the ED. She was not protecting her airway and [name] intubated patient. PCCM was called to admit.No other history is available and no family bedside.  Hospital Course:  1-acute resp failure with hypoxemia: due to pulmonary edema and presumed PNA -patient required intubation and never successfully improved; she was extubated on 5/16 with anticipation for comfort care if she struggles to breath -very tachypneic, with increase work of breathing and using accessory muscles -patient was non-verbal and unable to follow any commands -despite IV lasix and increase oxygen supplementation she continue to fail and rapidly decline -decision was made to initiate full comfort treatment -palliative care initiated morphine drip, scopolamine patch and PRN ativan. -patient passed away at 7:18 pm  2-severe decompensated systolic heart failure: EF 30-35% -patient was on tx with IV lasix and midodrine  -she failed to improved and after discussing with brother decision was made for her to transition to comfort care -critical care service extubated patient on 2/16; but she was never able to follow commands and rapidly decompensated -palliative care got on board and help with comfort  measures and treatment  3-PAF: presented with A. Fib and RVR, with associated hypotension  -required initial cardioversion by ED and intubation -patient rate got controlled, but remained in A. Fib -final decision was for comfort care -palliative care on board and telemetry was discontinued   4-AKI: unclear if acute on chronic -but worse secondary to hypotension and subsequently aggressive diuresis -decision was made for comfort care -no further labs checked   5-morbid obesity: -felt that most likely OHS and OSA contributed to SOB; no hx of sleep study found in her chart -ultimate plan and decision was for comfort care  6-stroke: left MCA CVA seen on CT scan and MRI -felt to be old -no further work up done, as plan was for comfort care  7-hyperglycemia and hypothyroidism  -plan was for comfort care now -SSI and synthroid discontinue   Time: 30 minutes  Signed:  Barton Dubois  [facility] 07-20-14, 6:17 PM

## 2014-07-10 NOTE — Consult Note (Signed)
Consultation Note Date: 07-15-2014   Patient Name: Ana Hampton  DOB: September 01, 1950  MRN: [id number]  Age / Sex: 64 y.o., female   PCP: No primary care provider on file. Referring Physician: Barton Dubois, MD  Reason for Consultation: Non pain symptom management, Pain control and Terminal care  Palliative Care Assessment and Plan Summary of Established Goals of Care and Medical Treatment Preferences    Palliative Care Discussion Held Today Contacts/Participants in Discussion: Primary Decision Maker: Brother - Durwin Reges     Ana Hampton is minimally responsive but still making eye contact at times. She appears in acute distress with severe respiratory difficulty. She is reddened and veins protruding from her forehead. Morphine infusion going at 1 mg/hr - I will increase to 2 mg/hr and liberalize bolus. Discussed care with RN at bedside.   I spoke with Ana Hampton brother via telephone who confirms plan for comfort care with understanding prognosis is very poor with kidneys shutting down with limited urine output. Prognosis likely hours but possibly a couple days. Will continue to follow for symptom management.   Code Status/Advance Care Planning:  DNR  Symptom Management:   Secretions: Scopolamine in place. Robinul ordered 0.2 mg every 4 hours.   Dyspnea/work of breathing: Morphine 2 mg/hr infusion with bolus 2 mg every 15 minutes prn.   Agitation: Lorazepam 1 mg every 3 hours prn.   Left upper arm rash: Red and patchy and appears that could be from irritation from a BP cuff (according to the location and borders of the rash). Recommend fungal powder from floor stock.   Psycho-social/Spiritual:   Support System: Brother (Richard Fairfield) enquires about cremation and is looking into plans for Ana Hampton according to the wishes she has expressed to him.    Prognosis: Hours to days.   Discharge Planning:  Anticipate hospital death.        Chief Complaint/History of Present  Illness: 64 year old with extensive psych history (bipolar?) adm via ED with AMS. She was not protecting her airway and [name] intubated patient. She was extubated 5/16 and made DNR with plan to transition to full comfort care with further decline. She has declined and is minimally responsive with severe respiratory distress and worsening renal function. PMH reviewed.   Primary Diagnoses  Present on Admission:  . Acute respiratory failure . Altered mental status . Polycythemia . AKI (acute kidney injury) . Cardiomyopathy  Palliative Review of Systems: Unable to assess - encephalopathy at end of life.   I have reviewed the medical record, interviewed the patient and family, and examined the patient. The following aspects are pertinent.  No past medical history on file. History   Social History  . Marital Status: Divorced    Spouse Name: N/A  . Number of Children: N/A  . Years of Education: N/A   Social History Main Topics  . Smoking status: Not on file  . Smokeless tobacco: Not on file  . Alcohol Use: Not on file  . Drug Use: Not on file  . Sexual Activity: Not on file   Other Topics Concern  . Not on file   Social History Narrative  . No narrative on file   No family history on file. Scheduled Meds: . antiseptic oral rinse  7 mL Mouth Rinse q12n4p  . chlorhexidine  15 mL Mouth Rinse BID  . glycopyrrolate  0.2 mg Intravenous Q4H  . hydrocerin   Topical BID  . mupirocin cream   Topical BID  .  scopolamine  1 patch Transdermal Q72H   Continuous Infusions: . sodium chloride 10 mL/hr at 06/23/14 1800  . morphine 2 mg/hr (2014-07-03 1047)   PRN Meds:.albuterol, bisacodyl, LORazepam, morphine injection Medications Prior to Admission:  Prior to Admission medications   Medication Sig Start Date End Date Taking? Authorizing Provider  albuterol (PROVENTIL) (2.5 MG/3ML) 0.083% nebulizer solution Take 3 mLs by nebulization every 4 (four) hours as needed for wheezing or shortness of  breath.  04/18/14  Yes Historical Provider, MD  esomeprazole (NEXIUM) 40 MG capsule Take 40 mg by mouth 2 (two) times daily before a meal.   Yes Historical Provider, MD  estradiol (ESTRACE) 1 MG tablet Take 1 mg by mouth daily.   Yes Historical Provider, MD  fenofibrate (TRICOR) 145 MG tablet Take 145 mg by mouth daily.   Yes Historical Provider, MD  furosemide (LASIX) 40 MG tablet Take 20-40 mg by mouth daily as needed for edema.   Yes Historical Provider, MD  PROAIR HFA 108 (90 BASE) MCG/ACT inhaler Inhale 2 puffs into the lungs every 6 (six) hours as needed for wheezing or shortness of breath.  04/24/14  Yes Historical Provider, MD  risperiDONE (RISPERDAL) 2 MG tablet Take 2-4 mg by mouth 2 (two) times daily. Take 2 mg in the morning and 4 mg in the evening.   Yes Historical Provider, MD  simvastatin (ZOCOR) 20 MG tablet Take 20 mg by mouth daily at 6 PM.   Yes Historical Provider, MD  SYMBICORT 160-4.5 MCG/ACT inhaler Inhale 2 puffs into the lungs 2 (two) times daily. 05/16/14  Yes Historical Provider, MD   Allergies  Allergen Reactions  . Abilify [Aripiprazole] Shortness Of Breath  . Geodon [Ziprasidone Hcl]   . Niaspan [Niacin Er]   . Prednisone    CBC:    Component Value Date/Time   WBC 12.4* 06/25/2014 0338   HGB 13.4 06/25/2014 0338   HCT 41.0 06/25/2014 0338   PLT 159 06/25/2014 0338   MCV 85.6 06/25/2014 0338   NEUTROABS 11.3* 06/25/2014 0338   LYMPHSABS 0.7 06/25/2014 0338   MONOABS 0.4 06/25/2014 0338   EOSABS 0.0 06/25/2014 0338   BASOSABS 0.0 06/25/2014 0338   Comprehensive Metabolic Panel:    Component Value Date/Time   NA 141 06/25/2014 0338   K 4.2 06/25/2014 0338   CL 104 06/25/2014 0338   CO2 26 06/25/2014 0338   BUN 98* 06/25/2014 0338   CREATININE 3.51* 06/25/2014 0338   GLUCOSE 89 06/25/2014 0338   CALCIUM 9.3 06/25/2014 0338   AST 31 06/23/2014 0314   ALT 29 06/23/2014 0314   ALKPHOS 166* 06/23/2014 0314   BILITOT 2.1* 06/23/2014 0314   PROT 4.7*  06/23/2014 0314   ALBUMIN 1.8* 06/23/2014 0314    Physical Exam: Vital Signs: BP 137/51 mmHg  Pulse 116  Temp(Src) 98.3 F (36.8 C) (Oral)  Resp 16  Ht 5\' 3"  (1.6 m)  Wt 112.583 kg (248 lb 3.2 oz)  BMI 43.98 kg/m2  SpO2 83% SpO2: SpO2: (!) 83 % O2 Device: O2 Device: Nasal Cannula O2 Flow Rate: O2 Flow Rate (L/min): 5 L/min Intake/output summary:  Intake/Output Summary (Last 24 hours) at 2014-07-03 1124 Last data filed at Jul 03, 2014 0858  Gross per 24 hour  Intake  71.83 ml  Output   1825 ml  Net -1753.17 ml   Baseline Weight: Weight: 86.183 kg (190 lb) Most recent weight: Weight: 112.583 kg (248 lb 3.2 oz)  Exam Findings:  Gen: Respiratory distress HEENT: Face reddened  with protruding veins CV: Tachycardic Pulm: Rhonchi throughout, labored breathing Abd: Soft, NT, ND, reddened/moist appearing on skin Extremities: Diffuse anasarca, weeping, 3+ edema, bilat toes beginning to appear cyanotic Neuro: Spontaneous eye opening, nonverbal, not following commands         Palliative Performance Scale: 10%              Additional Data Reviewed: Recent Labs     06/24/14  0416  06/25/14  0338  WBC   --   12.4*  HGB   --   13.4  PLT   --   159  NA  135  141  BUN  93*  98*  CREATININE  3.34*  3.51*     Time In: 0945 Time Out: 1045 Time Total: 6min  Greater than 50%  of this time was spent counseling and coordinating care related to the above assessment and plan.  Signed by: Pershing Proud, NP  Pershing Proud, NP  09/18/8865, 11:24 AM  Please contact Palliative Medicine Team phone at [phone] for questions and concerns.

## 2014-07-10 NOTE — Progress Notes (Signed)
[facility] PROGRESS NOTE  Ana Hampton JKK:[id number] DOB: 1950/12/12 DOA: 07/03/2014 PCP: No primary care provider on file.  Assessment/Plan: 1-acute resp failure with hypoxemia: due to pulmonary edema and presumed PNA -patient required intubation and never successfully improved; she was extubated on 5/17 with anticipation for comfort care if she struggles to breath -currently very tachypneic, with increase work of breathing and using accessory muscles -patient is non-verbal and unable to follow any commands -despite IV lasix and increase oxygen supplementation she continue to failed -decision was to initiate full comfort care and start morphine drip -palliative care on board, appreciate assistance and rec's -prognosis is very poor and will expect patient passing away in this admission  2-severe decompensated systolic heart failure: EF 30-35% -patient was on tx with IV lasix and midodrine  -she failed to improved and now plan is for comfort measures   3-PAF: presented with A. 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 CT scan and MRI -felt to be old -plan is for comfort care  7-hyperglycemia and hypothyroidism  -plan is for comfort care now -SSI and synthroid discontinue   Code Status: DNR Family Communication: no family at bedside; discussed with Aunt Bess Harvest [phone]  Disposition Plan: patient initiated on morphine drip; anticipating passing away during this admission (most likely today 5/18)   Consultants:  PCCM  Palliative Care   Procedures:  See below for  x-ray reports  Patient intubated for airway protection and extubated with intention of palliative care/comfort measures (5/6>>5/16)  Antibiotics:  Treated with Vanc and Zosyn (total of 5 days, for potential PNA)  HPI/Subjective: Patient with increase work of breathing and non-verbal; unable to follow any commands or take PO meds.  Objective: Filed Vitals:   Jul 01, 2014 1521  BP: 94/59  Pulse: 169  Temp: 98.5 F (36.9 C)  Resp: 20    Intake/Output Summary (Last 24 hours) at 07-01-14 1558 Last data filed at 07-01-2014 0858  Gross per 24 hour  Intake  61.83 ml  Output   1700 ml  Net -1638.17 ml   Filed Weights   06/23/14 0313 06/24/14 0359 06/25/14 1408  Weight: 115.8 kg (255 lb 4.7 oz) 117.8 kg (259 lb 11.2 oz) 112.583 kg (248 lb 3.2 oz)    Exam:   General:  In acute resp distress and non-verbal; patient escalate needs of O2 supplementation form 6L to NRB. Afebrile. Positive Anasarca   Cardiovascular:tachycardic, no murmurs, no rubs   Respiratory: positive use of accessory muscles, scattered rhonchi, poor air movement and very tachypneic    Abdomen: distended, positive wave fluid sign on exam  Musculoskeletal: positive 3++ edema, no clubbing   Data Reviewed: Basic Metabolic Panel:  Recent Labs Lab 06/20/14 0420 06/21/14 0446 06/23/14 0314 06/24/14 0416 06/25/14 0338  NA 129* 129* 135 135 141  K 4.6 4.2 4.3 4.2 4.2  CL 95* 95* 99* 100* 104  CO2 23 23 25 26 26   GLUCOSE 92 116* 114* 116* 89  BUN 70* 71* 83* 93* 98*  CREATININE 3.64* 3.74* 3.46* 3.34* 3.51*  CALCIUM 8.4* 8.5* 8.5* 8.5* 9.3  Liver Function Tests:  Recent Labs Lab 06/20/14 0420 06/23/14 0314  AST 32 31  ALT 27 29  ALKPHOS 82 166*  BILITOT 2.0* 2.1*  PROT 4.4* 4.7*  ALBUMIN 2.0* 1.8*    Recent Labs Lab 06/24/14 0935  AMMONIA 43*   CBC:  Recent Labs Lab 06/20/14 0420 06/23/14 0314 06/25/14 0338  WBC 7.9 6.5 12.4*  NEUTROABS  --   --  11.3*  HGB 15.2* 13.8 13.4  HCT 44.3  41.0 41.0  MCV 83.0 83.7 85.6  PLT 98* 123* 159   BNP (last 3 results)  Recent Labs  06/15/2014 0728  BNP 357.3*   CBG:  Recent Labs Lab 06/25/14 1140 06/25/14 1707 06/25/14 2012 07-02-14 0125 2014-07-02 0418  GLUCAP 95 96 98 110* 102*    Studies: No results found.  Scheduled Meds: . antiseptic oral rinse  7 mL Mouth Rinse q12n4p  . glycopyrrolate  0.2 mg Intravenous Q4H  . hydrocerin   Topical BID  . scopolamine  1 patch Transdermal Q72H   Continuous Infusions: . sodium chloride 10 mL/hr at 06/23/14 1800  . morphine 4 mg/hr (02-Jul-2014 1510)    Active Problems:   Acute respiratory failure   Altered mental status   Polycythemia   AKI (acute kidney injury)   Cardiomyopathy    Time spent: 30 minutes    Barton Dubois  [facility] Pager [phone]. If 7PM-7AM, please contact night-coverage at [email or web address], password [facility] 07/02/2014, 3:58 PM  LOS: 12 days

## 2014-07-10 NOTE — Progress Notes (Signed)
RT attempted to NT suction patient with RN at the bedside. Pt would not tolerate and refused. Pt placed on partial rebreather at 15L.

## 2014-07-10 NOTE — Progress Notes (Addendum)
Post mortem care performed. Removed foley and RIJ. Pt taken to morgue. [name], RN

## 2014-07-10 NOTE — Progress Notes (Signed)
MD Dyann Kief paged concerning no signed copy of patient DNR in file,  patient is currently on comfort care, MD Dyann Kief stated the DNR order was originally put in by MD [facility].    MD McQuaid called on cell phone to notify him of patient not having signed copy of DNR in chart, while patient is currently actively dying on comfort care. MD McQuaid stated, " the DNR order is active in the patient's electronic chart and has been since the patient was in the ICU at [facility]." He has no knowledge of where the physical signed copy of DNR is, and deferred me back to the covering [facility] MD Dyann Kief.  MD Dyann Kief paged and spoken to via phone, he conveyed he does not know where the hard copy of the DNR paperwork is either, and that the situation will be addressed tomorrow on day shift.

## 2014-07-10 NOTE — Progress Notes (Signed)
MD Dyann Kief paged, [name] (brother) at bedside and would like to speak with him about patient's care.

## 2014-07-10 NOTE — Progress Notes (Signed)
MD Schorr paged to notify her patient has deceased, death certificate left with charge nurse at front desk of 5W for MD.

## 2014-07-10 NOTE — Progress Notes (Addendum)
NCM spoke with MD , he states patient is actively dying, patient is too unstable for transfer. NCM asked about GIP.  MD states if we can do GIP , then he would be ok with it.  NCM called Durwin Reges, patient's half brother who takes care of her and explained GIP to him, he states he is interested in doing this and he would like for [facility] to follow patient, referral made to [place].  There is a palliative consult ordered as well. Left message with Leonides Grills to return call as well. Attending would like Palliative Services to follow patient with GIP.

## 2014-07-10 NOTE — Progress Notes (Signed)
Was called to patient's room by Frederico Hamman, RN.  Pt does not appear to be breathing.  Verified death by two RNs; Hendricks Limes and Zenon Mayo, RN.  Family notified.

## 2014-07-10 NOTE — Progress Notes (Signed)
[facility] contacted, spoke with Vira Blanco, reference number [id number].

## 2014-07-10 NOTE — Progress Notes (Signed)
Notified [name] (brother) that his sister had passed away. Told [name] that [employer] (brother) had requested that we contact him. [name] stated that he already had heard from a family friend that she had passed. [name], RN

## 2014-07-10 NOTE — Progress Notes (Signed)
MD [facility] paged, patient refused RT suction, unable to take any PO medications r/t gagging, choking, and respiratory compromise, currently on partial re-breather at 15L from previous 6L nasal cannula.

## 2014-07-10 NOTE — Progress Notes (Signed)
210ml waste of patient IV morphine drip in sink. RN witnesses Hendricks Limes RN and Urban Gibson RN.

## 2014-07-10 NOTE — Progress Notes (Signed)
GIP canceled,  Palliative states they do not think we need this because patient is now off rebreather and on [place].  NCM notified [facility] and MD and Durwin Reges.

## 2014-07-10 NOTE — Progress Notes (Signed)
Ana Hampton appears more comfortable with less muscle tension and less tachypnea. Still having increased work of breathing. I will increase Morphine infusion to 4 mg/hr to better control work of breathing and ensure comfort. I have also changed her from nonrebreather to nasal cannula for comfort - nonrebreather is not helping her at this point but only used to attempt to fix a number and prolong her suffering - this will not impact the outcome. Continue to utilize morphine bolus to manage respiratory distress. Prognosis likely hours.   Vinie Sill, NP Palliative Medicine Team Pager # [phone] (M-F 8a-5p) Team Phone # [phone] (Nights/Weekends)

## 2014-07-10 NOTE — Progress Notes (Signed)
[place], patient's brother.  Left a voicemail.  Will continue to try.

## 2014-07-10 NOTE — Progress Notes (Signed)
Left voice mail for Durwin Reges (brother) that pt's belongings will be placed at nurse's station and that he can come pick up. [name], RN

## 2014-07-10 NOTE — Progress Notes (Addendum)
Ana Hampton back.  Denied autopsy due to financial reasons, but unsure about patient's other brother.  Tried calling [name] [phone]) twice with no response.  [place] aware.  No family at bedside to see body.  Durwin Reges will call back when he decides on a funeral home.  Will prepare pt to go to morgue.

## 2014-07-10 DEATH — deceased

## 2016-01-18 IMAGING — CR DG CHEST 1V PORT
1 series · 1 of 1 positions shown · non-contrast
Comparison: June 21, 2014.

CLINICAL DATA: Acute respiratory failure.

EXAM:
PORTABLE CHEST - 1 VIEW

[AP]
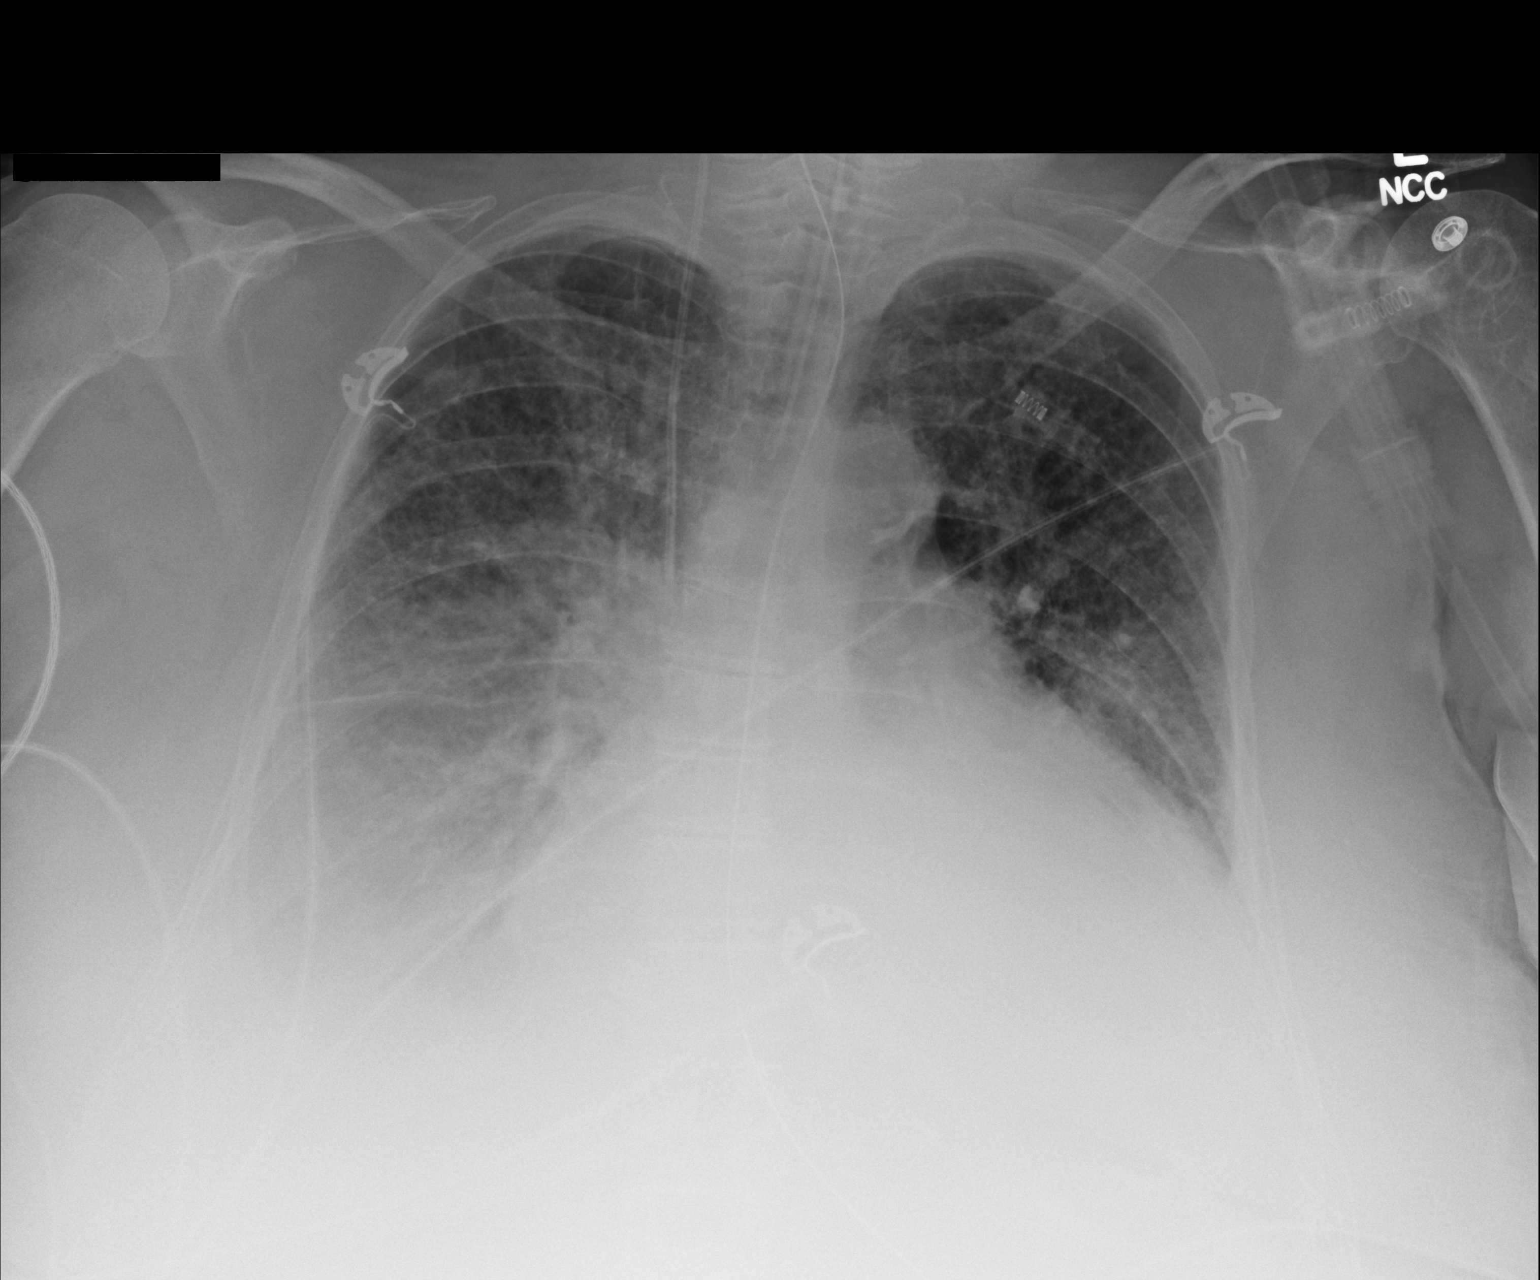

[1 of 1 positions shown; findings below may reference images not displayed]

FINDINGS: Stable cardiomegaly. Endotracheal nasogastric tubes are unchanged in
position. Right internal jugular catheter is unchanged. Stable
bilateral perihilar and basilar pulmonary edema is noted with
associated pleural effusions. No pneumothorax is noted.
IMPRESSION: Stable cardiomegaly and bilateral pulmonary edema and pleural
effusions consistent with congestive heart failure. Stable support
apparatus.
